# Patient Record
Sex: Female | Born: 1950 | Race: White | Hispanic: No | Smoking: Never smoker
Health system: Southern US, Community
[De-identification: ages and names within clinical notes are randomized; demographics above are authoritative.]

## PROBLEM LIST (undated history)

## (undated) DIAGNOSIS — F419 Anxiety disorder, unspecified: Secondary | ICD-10-CM

## (undated) DIAGNOSIS — F329 Major depressive disorder, single episode, unspecified: Secondary | ICD-10-CM

## (undated) DIAGNOSIS — F32A Depression, unspecified: Secondary | ICD-10-CM

## (undated) DIAGNOSIS — K219 Gastro-esophageal reflux disease without esophagitis: Secondary | ICD-10-CM

## (undated) HISTORY — DX: Gastro-esophageal reflux disease without esophagitis: K21.9

## (undated) HISTORY — PX: ADENOIDECTOMY: SUR15

## (undated) HISTORY — DX: Anxiety disorder, unspecified: F41.9

## (undated) HISTORY — DX: Depression, unspecified: F32.A

## (undated) HISTORY — PX: OOPHORECTOMY: SHX86

## (undated) HISTORY — DX: Major depressive disorder, single episode, unspecified: F32.9

## (undated) HISTORY — PX: EYE SURGERY: SHX253

## (undated) HISTORY — PX: WISDOM TOOTH EXTRACTION: SHX21

---

## 2004-01-14 ENCOUNTER — Emergency Department (HOSPITAL_COMMUNITY): Admission: EM | Admit: 2004-01-14 | Discharge: 2004-01-14 | Payer: Self-pay | Admitting: *Deleted

## 2004-03-27 ENCOUNTER — Other Ambulatory Visit: Admission: RE | Admit: 2004-03-27 | Discharge: 2004-03-27 | Payer: Self-pay | Admitting: Obstetrics and Gynecology

## 2005-05-01 ENCOUNTER — Other Ambulatory Visit: Admission: RE | Admit: 2005-05-01 | Discharge: 2005-05-01 | Payer: Self-pay | Admitting: Obstetrics and Gynecology

## 2007-05-20 HISTORY — PX: COLONOSCOPY: SHX174

## 2008-02-21 ENCOUNTER — Ambulatory Visit (HOSPITAL_COMMUNITY): Admission: RE | Admit: 2008-02-21 | Discharge: 2008-02-21 | Payer: Self-pay | Admitting: *Deleted

## 2008-02-22 ENCOUNTER — Ambulatory Visit (HOSPITAL_COMMUNITY): Admission: RE | Admit: 2008-02-22 | Discharge: 2008-02-22 | Payer: Self-pay | Admitting: *Deleted

## 2010-10-01 NOTE — Op Note (Signed)
Martha Spencer, Martha Spencer                  ACCOUNT NO.:  [id number]   MEDICAL RECORD NO.:  [id number]          PATIENT TYPE:  AMB   LOCATION:  ENDO                         FACILITY:  [facility]   PHYSICIAN:  Georgiana Spinner, M.D.    DATE OF BIRTH:  1951/02/14   DATE OF PROCEDURE:  02/21/2008  DATE OF DISCHARGE:                               OPERATIVE REPORT   PROCEDURE:  Colonoscopy.   INDICATIONS:  Colon cancer screening.   ANESTHESIA:  Fentanyl 100 mcg, Versed 8 mg.   PROCEDURE:  With the patient mildly sedated in the left lateral  decubitus position the Pentax videoscopic pediatric colonoscope was  inserted in the rectum and passed under direct vision to the right  colon.  The prep was poor in that there were multiple areas where there  was deep brownish liquid material that had solid material in it that  precluded thorough suctioning and we reached a point where I could pass  the scope no further and it was elected at this time to then terminate  the procedure and withdraw.  We withdrew the colonoscope taking  circumferential views of colonic mucosa as best we could, suctioning as  we went until we reached the rectum which appeared normal on direct and  retroflexed view.  The endoscope was straightened and withdrawn.  The  patient's vital signs, pulse oximeter remained stable.  The patient  tolerated procedure well without apparent complications.   FINDINGS:  Negative examination but prep was poor and precluded a  thorough exam, so even small lesions could have been missed.   PLAN:  Will proceed with barium enema at a later date when the patient  can be prepped again, hopefully in a better fashion to look at the  remainder of the colon.  Consider repeat examination in 5-10 years.           ______________________________  Georgiana Spinner, M.D.     GMO/MEDQ  D:  02/21/2008  T:  02/21/2008  Job:  [id number]

## 2012-11-04 ENCOUNTER — Other Ambulatory Visit: Payer: Self-pay | Admitting: Obstetrics and Gynecology

## 2012-11-04 DIAGNOSIS — R9230 Dense breasts, unspecified: Secondary | ICD-10-CM

## 2012-11-04 DIAGNOSIS — R922 Inconclusive mammogram: Secondary | ICD-10-CM

## 2012-11-15 ENCOUNTER — Other Ambulatory Visit: Payer: Self-pay

## 2012-11-16 ENCOUNTER — Ambulatory Visit
Admission: RE | Admit: 2012-11-16 | Discharge: 2012-11-16 | Disposition: A | Discharge status: Home / Self Care | Payer: 59 | Source: Ambulatory Visit | Attending: Obstetrics and Gynecology | Admitting: Obstetrics and Gynecology

## 2012-11-16 DIAGNOSIS — R922 Inconclusive mammogram: Secondary | ICD-10-CM

## 2012-11-16 MED ORDER — GADOBENATE DIMEGLUMINE 529 MG/ML IV SOLN
14.0000 mL | Freq: Once | INTRAVENOUS | Status: AC | PRN
  Administered 2012-11-16: 14 mL via INTRAVENOUS

## 2013-03-23 ENCOUNTER — Encounter (INDEPENDENT_AMBULATORY_CARE_PROVIDER_SITE_OTHER): Payer: Self-pay | Admitting: Surgery

## 2013-04-05 ENCOUNTER — Other Ambulatory Visit (INDEPENDENT_AMBULATORY_CARE_PROVIDER_SITE_OTHER): Payer: Self-pay | Admitting: Surgery

## 2013-04-05 ENCOUNTER — Ambulatory Visit (INDEPENDENT_AMBULATORY_CARE_PROVIDER_SITE_OTHER): Payer: 59 | Admitting: Surgery

## 2013-04-05 ENCOUNTER — Encounter (INDEPENDENT_AMBULATORY_CARE_PROVIDER_SITE_OTHER): Payer: Self-pay | Admitting: Surgery

## 2013-04-05 VITALS — BP 120/70 | HR 76 | Temp 97.4°F | Resp 14 | Ht 67.5 in | Wt 157.8 lb

## 2013-04-05 DIAGNOSIS — K469 Unspecified abdominal hernia without obstruction or gangrene: Secondary | ICD-10-CM

## 2013-04-05 DIAGNOSIS — R109 Unspecified abdominal pain: Secondary | ICD-10-CM

## 2013-04-05 DIAGNOSIS — R1903 Right lower quadrant abdominal swelling, mass and lump: Secondary | ICD-10-CM | POA: Insufficient documentation

## 2013-04-05 NOTE — Progress Notes (Signed)
Patient ID: Martha Spencer, female   DOB: 1951-05-13, 62 y.o.   MRN: [id number]  Chief Complaint  Patient presents with  . New Evaluation    eval abd wall hernia    HPI Martha Spencer is a 62 y.o. female.  Patient referred by Dr. Juluis Rainier for evaluation of possible right abdominal wall hernia  HPI This is a 62 year old female who presents with a three-month history of some bulging in her right abdominal wall lateral to her umbilicus. This has become slightly larger and occasionally is uncomfortable. She denies any history of trauma to this area. The bulge is not palpable when she is supine.  She has not had any imaging of this area.   Past Medical History  Diagnosis Date  . Depression   . Anxiety     Past Surgical History  Procedure Laterality Date  . Oophorectomy    . Eye surgery      History reviewed. No pertinent family history.  Social History History  Substance Use Topics  . Smoking status: Never Smoker   . Smokeless tobacco: Never Used  . Alcohol Use: No    Allergies  Allergen Reactions  . Sulfa Antibiotics     Current Outpatient Prescriptions  Medication Sig Dispense Refill  . venlafaxine XR (EFFEXOR-XR) 150 MG 24 hr capsule Take 150 mg by mouth daily with breakfast.       No current facility-administered medications for this visit.    Review of Systems Review of Systems  Constitutional: Negative for fever, chills and unexpected weight change.  HENT: Negative for congestion, hearing loss, sore throat, trouble swallowing and voice change.   Eyes: Negative for visual disturbance.  Respiratory: Negative for cough and wheezing.   Cardiovascular: Negative for chest pain, palpitations and leg swelling.  Gastrointestinal: Negative for nausea, vomiting, abdominal pain, diarrhea, constipation, blood in stool, abdominal distention and anal bleeding.  Genitourinary: Negative for hematuria, vaginal bleeding and difficulty urinating.  Musculoskeletal: Negative for  arthralgias.  Skin: Negative for rash and wound.  Neurological: Negative for seizures, syncope and headaches.  Hematological: Negative for adenopathy. Does not bruise/bleed easily.  Psychiatric/Behavioral: Negative for confusion.    Blood pressure 120/70, pulse 76, temperature 97.4 F (36.3 C), temperature source Temporal, resp. rate 14, height 5' 7.5" (1.715 m), weight 157 lb 12.8 oz (71.578 kg).  Physical Exam Physical Exam WDWN in NAD HEENT:  EOMI, sclera anicteric Neck:  No masses, no thyromegaly Lungs:  CTA bilaterally; normal respiratory effort CV:  Regular rate and rhythm; no murmurs Abd:  +bowel sounds, soft, non-tender, slight assymmetry when standing with some bulging of the right abdominal wall just below the level of the umbilicus; no obvious hernia with Valsalva.  This area is not palpable when she is supine Ext:  Well-perfused; no edema Skin:  Warm, dry; no sign of jaundice  Data Reviewed none  Assessment    Right abdominal wall asymmetry - possible Spigelian hernia vs. Intra-muscular lipoma.  Unable to determine on physical examination     Plan    CT scan of the abdomen and pelvis to rule out spigelian hernia.  Follow-up 2 weeks to review scan.        Martha Spencer K. 04/05/2013, 12:54 PM

## 2013-04-07 ENCOUNTER — Ambulatory Visit
Admission: RE | Admit: 2013-04-07 | Discharge: 2013-04-07 | Disposition: A | Discharge status: Home / Self Care | Payer: 59 | Source: Ambulatory Visit | Attending: Surgery | Admitting: Surgery

## 2013-04-07 ENCOUNTER — Telehealth (INDEPENDENT_AMBULATORY_CARE_PROVIDER_SITE_OTHER): Payer: Self-pay | Admitting: General Surgery

## 2013-04-07 DIAGNOSIS — K469 Unspecified abdominal hernia without obstruction or gangrene: Secondary | ICD-10-CM

## 2013-04-07 DIAGNOSIS — R109 Unspecified abdominal pain: Secondary | ICD-10-CM

## 2013-04-07 MED ORDER — IOHEXOL 300 MG/ML  SOLN
100.0000 mL | Freq: Once | INTRAMUSCULAR | Status: AC | PRN
  Administered 2013-04-07: 100 mL via INTRAVENOUS

## 2013-04-07 NOTE — Telephone Encounter (Signed)
Called patient to let her know that she was significant constipation and did not have a hernia, and she will need to get Miralax to make sure that she is having good daily bowel movements. And Dr Corliss Skains will go over the CT when she comes back in for a follow up visit

## 2013-04-07 NOTE — Progress Notes (Signed)
Quick Note:  Please call the patient and let them know that their CT scan showed no sign of hernia. She has significant constipation, which may be causing the bulging in her abdomen. She should try some Miralax to make sure that she is having good daily bowel movements. We will go over her CT scan when she comes in for her visit.  ______

## 2013-04-11 ENCOUNTER — Encounter (INDEPENDENT_AMBULATORY_CARE_PROVIDER_SITE_OTHER): Payer: Self-pay | Admitting: Surgery

## 2013-04-11 ENCOUNTER — Ambulatory Visit (INDEPENDENT_AMBULATORY_CARE_PROVIDER_SITE_OTHER): Payer: 59 | Admitting: Surgery

## 2013-04-11 VITALS — BP 100/68 | HR 68 | Resp 16 | Ht 67.75 in | Wt 155.4 lb

## 2013-04-11 DIAGNOSIS — K5900 Constipation, unspecified: Secondary | ICD-10-CM

## 2013-04-11 DIAGNOSIS — K5909 Other constipation: Secondary | ICD-10-CM | POA: Insufficient documentation

## 2013-04-11 NOTE — Patient Instructions (Signed)
Clear liquids, magnesium citrate (1-2 bottles) until bowel movements clear Fiber supplement

## 2013-04-11 NOTE — Progress Notes (Signed)
Followup after her CT scan. The scan showed no sign of abdominal wall hernia. She does have significant constipation throughout her colon. We reviewed the images. We discussed a regimen for and occasional bowel prep with clear liquids and magnesium citrate. If these problems persist then she may need a referral to GI for further management. There are no surgical indications at this time. We will be glad to see her back as needed.   Ct Abdomen Pelvis W Contrast  04/07/2013   CLINICAL DATA:  Right lower quadrant discomfort, evaluate for possible hernia  EXAM: CT ABDOMEN AND PELVIS WITH CONTRAST  TECHNIQUE: Multidetector CT imaging of the abdomen and pelvis was performed using the standard protocol following bolus administration of intravenous contrast.  CONTRAST:  OMNIPAQUE IOHEXOL 300 MG/ML  SOLN  COMPARISON:  Nine  BUN and creatinine were obtained on site at [facility] at [address]Results: BUN 14 mg/dL, Creatinine 0.9 mg/dL.  FINDINGS: The lung bases are clear. The liver enhances, it is a probable cyst anteriorly in the right lobe of approximately 1.4 cm. In addition, posteriorly in the right lobe peripherally and inferiorly in the right lobe, there are low-attenuation structures which appear to enhance peripherally, and become almost isodense on delayed images, consistent with incidental hepatic hemangiomas. No calcified gallstones are noted. The pancreas is normal in size in the pancreatic duct is not dilated. The adrenal glands and spleen are unremarkable. The stomach is moderately fluid distended with no abnormality evident. The kidneys enhance with no calculus or mass and no hydronephrosis is seen. On delayed images, the pelvocaliceal systems are unremarkable. There is a tiny cyst in the upper pole of the right kidney posterior medially. The abdominal aorta is normal in caliber. No adenopathy is seen.  No abdominal wall hernia is noted. The urinary bladder is not well distended but  no abnormality is noted. The uterus is normal in size and a small left adnexal cyst is noted of 1.9 cm. No free fluid is seen within the pelvis. There is a moderate amount of feces throughout the colon. The terminal ileum and the appendix are unremarkable. There is degenerative disc disease primarily at L4-5 and L5-S1 levels.  IMPRESSION: 1. No explanation for the right lower quadrant discomfort is seen. No abdominal wall hernia is noted. 2. 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 3. The appendix and terminal ileum are unremarkable. 4. Degenerative disc disease primarily at L4-5 and L5-S1.   Electronically Signed   By: Dwyane Dee M.D.   On: 04/07/2013 10:33     Wilmon Arms. Corliss Skains, MD, [facility]  General/ Trauma Surgery  04/11/2013 2:48 PM

## 2015-03-19 ENCOUNTER — Other Ambulatory Visit: Payer: Self-pay | Admitting: Obstetrics and Gynecology

## 2015-03-19 DIAGNOSIS — M2611 Maxillary asymmetry: Secondary | ICD-10-CM

## 2015-03-26 ENCOUNTER — Other Ambulatory Visit: Payer: Self-pay | Admitting: Obstetrics and Gynecology

## 2015-03-26 ENCOUNTER — Ambulatory Visit
Admission: RE | Admit: 2015-03-26 | Discharge: 2015-03-26 | Disposition: A | Discharge status: Home / Self Care | Payer: 59 | Source: Ambulatory Visit | Attending: Obstetrics and Gynecology | Admitting: Obstetrics and Gynecology

## 2015-03-26 DIAGNOSIS — M2611 Maxillary asymmetry: Secondary | ICD-10-CM

## 2015-03-26 DIAGNOSIS — R2230 Localized swelling, mass and lump, unspecified upper limb: Secondary | ICD-10-CM

## 2015-03-26 DIAGNOSIS — R222 Localized swelling, mass and lump, trunk: Secondary | ICD-10-CM

## 2016-06-11 IMAGING — MG MM DIAG BREAST TOMO UNI RIGHT
6 series · 6 of 18 positions shown · non-contrast
Comparison: 12/06/2014

CLINICAL DATA: 64-year-old female with focal pain and thickening in
the right axilla discovered on self-examination.

EXAM:
DIGITAL DIAGNOSTIC RIGHT MAMMOGRAM WITH 3D TOMOSYNTHESIS WITH CAD
ULTRASOUND RIGHT AXILLA

[R TAN]
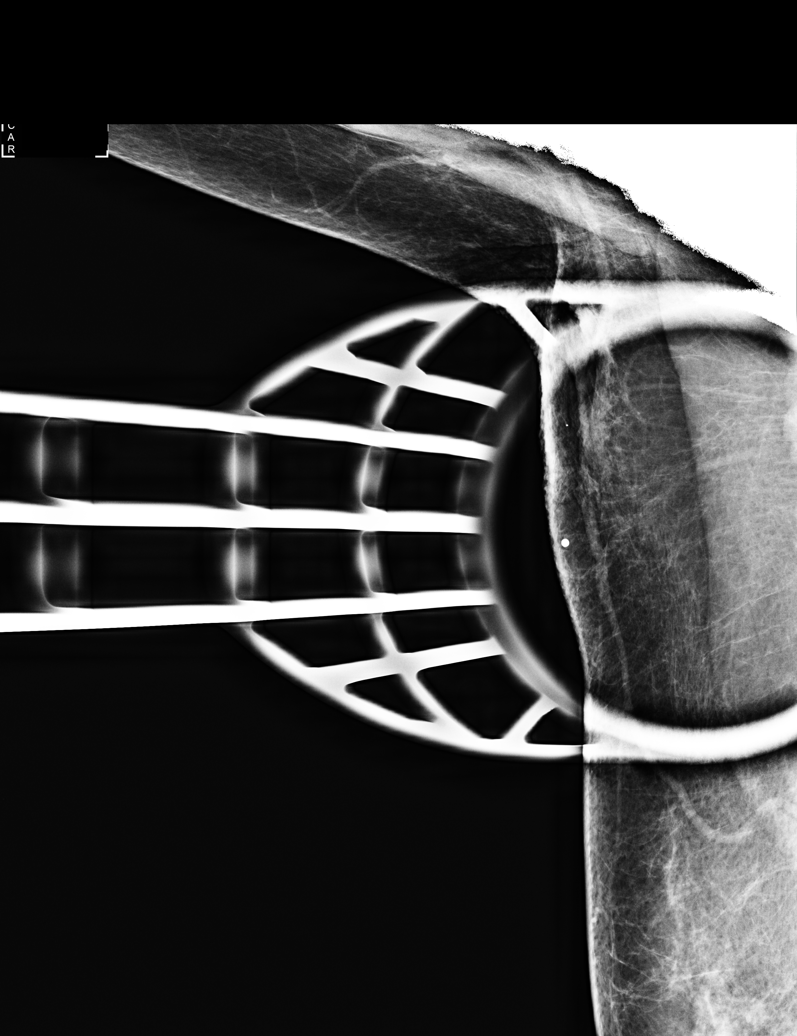

[R MLO]
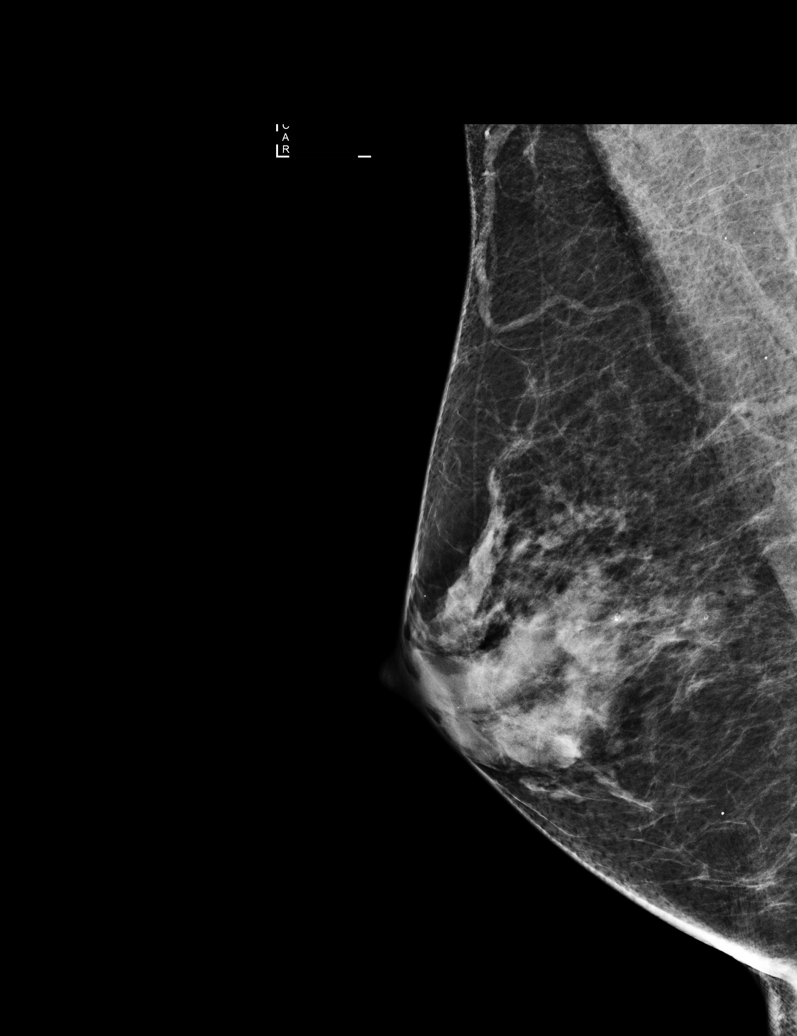

[R CC]
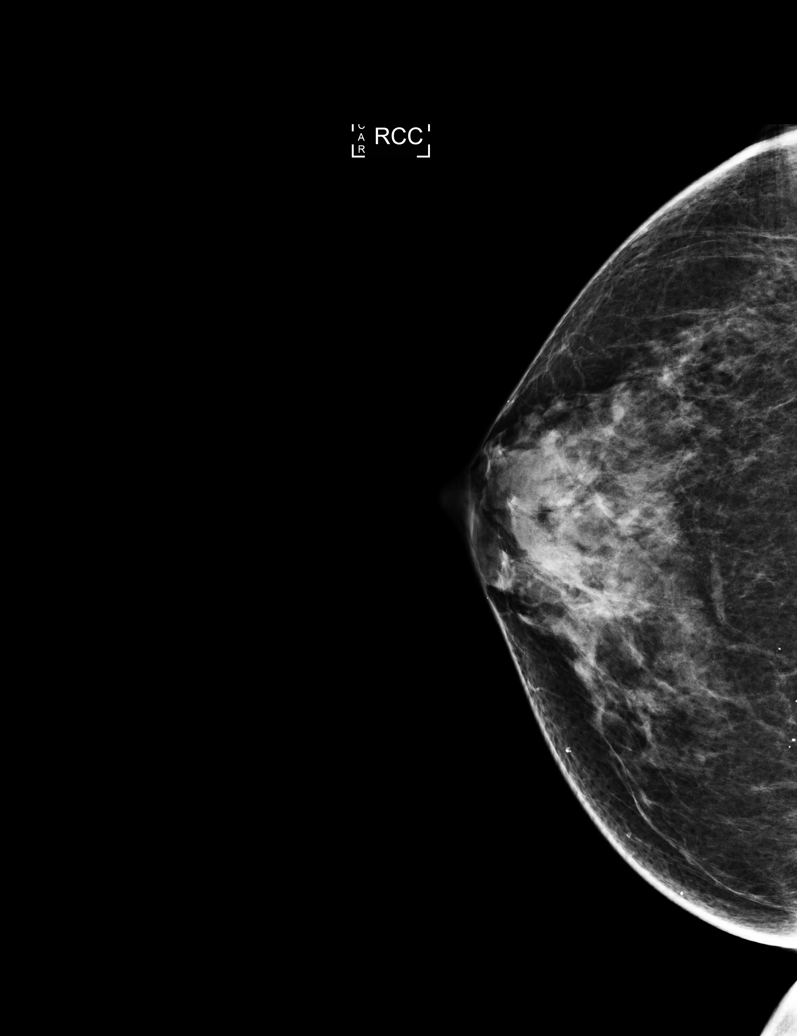

[R CC tomo · tomo slice 29/58.0]
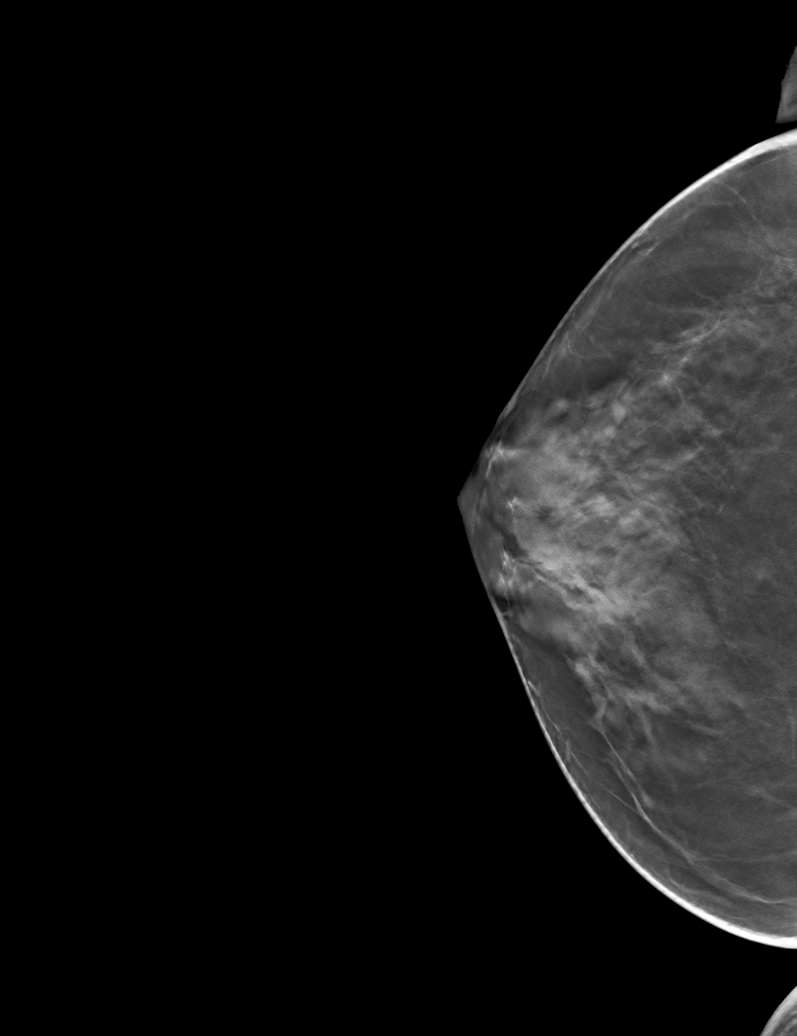

[R MLO tomo · tomo slice 31/61.0]
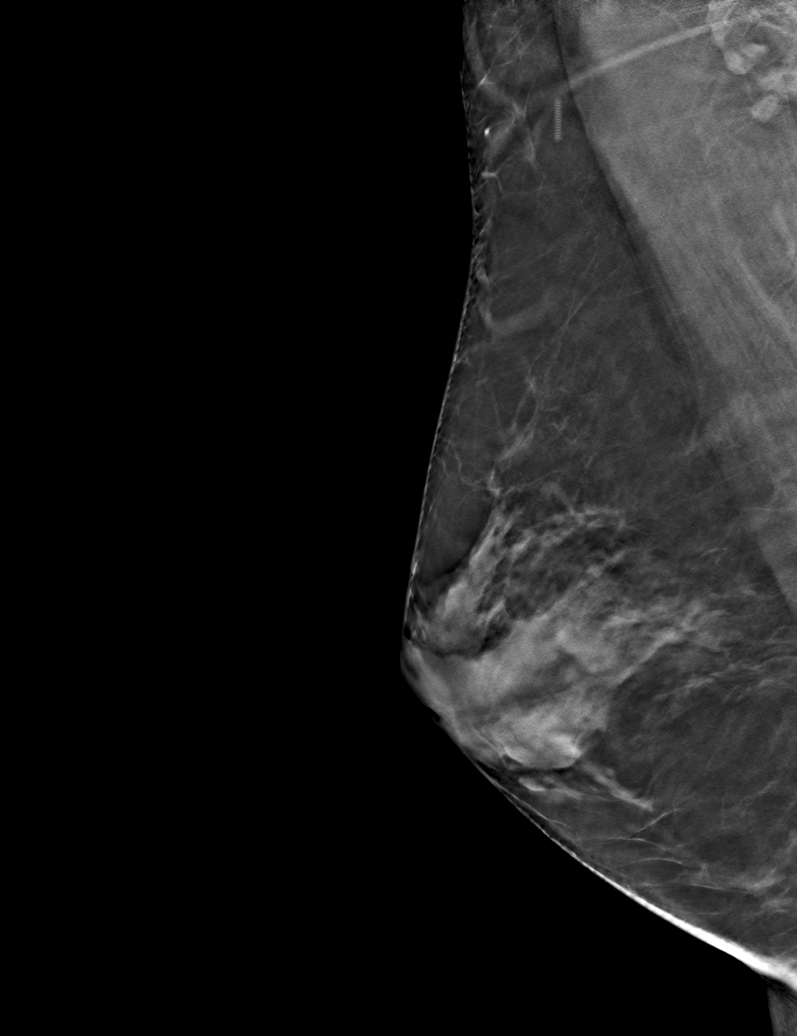

[R TAN tomo · tomo slice 24/47.0]
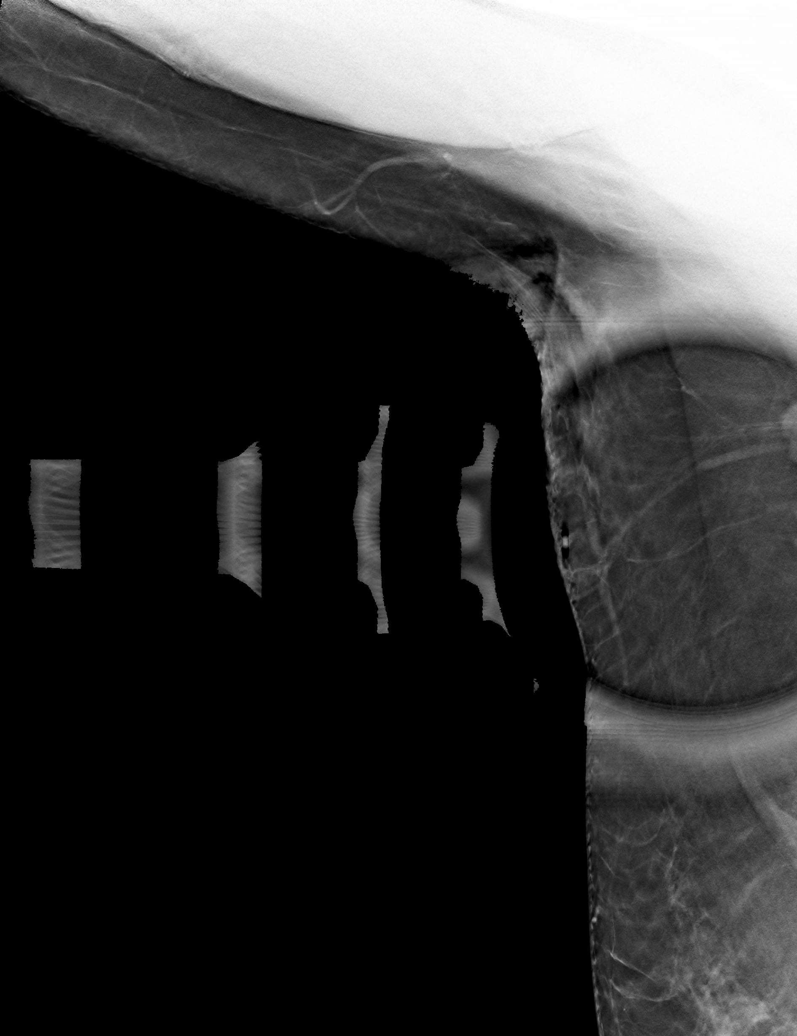

[6 of 18 positions shown; findings below may reference images not displayed]

ACR Breast Density Category c: The breast tissue is heterogeneously
dense, which may obscure small masses.
FINDINGS: 2D and 3D full field views of the right breast and spot compression
view of the right axilla demonstrates no suspicious mass, distortion
or worrisome calcifications.

Mammographic images were processed with CAD.

On physical exam, no palpable abnormalities identified in the right
axilla.

Targeted ultrasound is performed, showing no evidence of solid or
cystic mass, distortion or abnormal areas of shadowing within the
right axilla.
IMPRESSION: No mammographic, suspicious palpable or sonographic abnormality
within the right axilla.

No mammographic evidence of right breast malignancy.

RECOMMENDATION:
Bilateral screening mammograms in 8 months to resume annual
mammogram schedule.

I have discussed the findings and recommendations with the patient.
Results were also provided in writing at the conclusion of the
visit. If applicable, a reminder letter will be sent to the patient
regarding the next appointment.

BI-RADS CATEGORY  1: Negative.

## 2017-12-11 ENCOUNTER — Encounter: Payer: Self-pay | Admitting: Gastroenterology

## 2018-01-26 ENCOUNTER — Telehealth: Payer: Self-pay | Admitting: Gastroenterology

## 2018-01-26 ENCOUNTER — Ambulatory Visit: Payer: Medicare PPO | Admitting: *Deleted

## 2018-01-26 VITALS — Ht 67.0 in | Wt 148.0 lb

## 2018-01-26 DIAGNOSIS — Z1211 Encounter for screening for malignant neoplasm of colon: Secondary | ICD-10-CM

## 2018-01-26 MED ORDER — NA SULFATE-K SULFATE-MG SULF 17.5-3.13-1.6 GM/177ML PO SOLN: 1.0000 | Freq: Once | ORAL | 0 refills | Status: AC

## 2018-01-26 NOTE — Telephone Encounter (Signed)
Spoke with pt. Will send new instructions for her colon on 03/15/18 with Dr Tarri Glenn. Pt will call back if she has further questions. Gwyndolyn Saxon

## 2018-01-26 NOTE — Progress Notes (Signed)
No egg or soy allergy known to patient  No issues with past sedation with any surgeries  or procedures, no intubation problems  No diet pills per patient No home 02 use per patient  No blood thinners per patient  Pt denies issues with constipation  No A fib or A flutter  EMMI video sent to pt's e mail  

## 2018-02-09 ENCOUNTER — Encounter: Payer: 59 | Admitting: Gastroenterology

## 2018-03-02 ENCOUNTER — Encounter: Payer: Self-pay | Admitting: Gastroenterology

## 2018-03-15 ENCOUNTER — Ambulatory Visit (AMBULATORY_SURGERY_CENTER): Payer: 59 | Admitting: Gastroenterology

## 2018-03-15 ENCOUNTER — Encounter: Payer: Self-pay | Admitting: Gastroenterology

## 2018-03-15 ENCOUNTER — Other Ambulatory Visit: Payer: Self-pay

## 2018-03-15 VITALS — BP 126/72 | HR 70 | Temp 98.0°F | Resp 11 | Ht 67.0 in | Wt 148.0 lb

## 2018-03-15 DIAGNOSIS — Z1211 Encounter for screening for malignant neoplasm of colon: Secondary | ICD-10-CM | POA: Diagnosis not present

## 2018-03-15 DIAGNOSIS — K635 Polyp of colon: Secondary | ICD-10-CM | POA: Diagnosis not present

## 2018-03-15 DIAGNOSIS — D122 Benign neoplasm of ascending colon: Secondary | ICD-10-CM

## 2018-03-15 DIAGNOSIS — D125 Benign neoplasm of sigmoid colon: Secondary | ICD-10-CM

## 2018-03-15 NOTE — Progress Notes (Signed)
To PACU, VSS. Report to [email or web address].tb 

## 2018-03-15 NOTE — Patient Instructions (Signed)
YOU HAD AN ENDOSCOPIC PROCEDURE TODAY AT [facility]:   Refer to the procedure report that was given to you for any specific questions about what was found during the examination.  If the procedure report does not answer your questions, please call your gastroenterologist to clarify.  If you requested that your care partner not be given the details of your procedure findings, then the procedure report has been included in a sealed envelope for you to review at your convenience later.  YOU SHOULD EXPECT: Some feelings of bloating in the abdomen. Passage of more gas than usual.  Walking can help get rid of the air that was put into your GI tract during the procedure and reduce the bloating. If you had a lower endoscopy (such as a colonoscopy or flexible sigmoidoscopy) you may notice spotting of blood in your stool or on the toilet paper. If you underwent a bowel prep for your procedure, you may not have a normal bowel movement for a few days.  Please Note:  You might notice some irritation and congestion in your nose or some drainage.  This is from the oxygen used during your procedure.  There is no need for concern and it should clear up in a day or so.  SYMPTOMS TO REPORT IMMEDIATELY:   Following lower endoscopy (colonoscopy or flexible sigmoidoscopy):  Excessive amounts of blood in the stool  Significant tenderness or worsening of abdominal pains  Swelling of the abdomen that is new, acute  Fever of 100F or higher   For urgent or emergent issues, a gastroenterologist can be reached at any hour by calling [phone].   DIET:  We do recommend a small meal at first, but then you may proceed to your regular diet.  Drink plenty of fluids but you should avoid alcoholic beverages for 24 hours. Try to eat plenty of fiber, and drink a lot of water.  ACTIVITY:  You should plan to take it easy for the rest of today and you should NOT DRIVE or use heavy machinery until tomorrow  (because of the sedation medicines used during the test).    FOLLOW UP: Our staff will call the number listed on your records the next business day following your procedure to check on you and address any questions or concerns that you may have regarding the information given to you following your procedure. If we do not reach you, we will leave a message.  However, if you are feeling well and you are not experiencing any problems, there is no need to return our call.  We will assume that you have returned to your regular daily activities without incident.  If any biopsies were taken you will be contacted by phone or by letter within the next 1-3 weeks.  Please call us at [phone] if you have not heard about the biopsies in 3 weeks.    SIGNATURES/CONFIDENTIALITY: You and/or your care partner have signed paperwork which will be entered into your electronic medical record.  These signatures attest to the fact that that the information above on your After Visit Summary has been reviewed and is understood.  Full responsibility of the confidentiality of this discharge information lies with you and/or your care-partner.  Read all handouts given to you by your recovery room nurse.

## 2018-03-15 NOTE — Progress Notes (Signed)
History reviewed today 

## 2018-03-15 NOTE — Op Note (Signed)
[facility] Patient Name: Mercedez Boule Procedure Date: 03/15/2018 1:33 PM MRN: [id number] Endoscopist: Thornton Park MD, MD Age: 67 Referring MD:  Date of Birth: 06-21-1950 Gender: Female Account #: [id number] Procedure:                Colonoscopy Indications:              Screening for colorectal malignant neoplasm. No                            known family history of colon cancer or polyps. No                            baseline GI symptoms except for chronic                            constipation. Medicines:                See the Anesthesia note for documentation of the                            administered medications Procedure:                Pre-Anesthesia Assessment:                           - Prior to the procedure, a History and Physical                            was performed, and patient medications and                            allergies were reviewed. The patient's tolerance of                            previous anesthesia was also reviewed. The risks                            and benefits of the procedure and the sedation                            options and risks were discussed with the patient.                            All questions were answered, and informed consent                            was obtained. Prior Anticoagulants: The patient has                            taken no previous anticoagulant or antiplatelet                            agents. ASA Grade Assessment: II - A patient with  mild systemic disease. After reviewing the risks                            and benefits, the patient was deemed in                            satisfactory condition to undergo the procedure.                           After obtaining informed consent, the colonoscope                            was passed under direct vision. Throughout the                            procedure, the patient's blood pressure, pulse, and                             oxygen saturations were monitored continuously. The                            Colonoscope was introduced through the anus and                            advanced to the the cecum, identified by                            appendiceal orifice and ileocecal valve. The                            colonoscopy was technically difficult and complex                            due to a redundant colon, significant looping and a                            tortuous colon. The patient tolerated the procedure                            well. The quality of the bowel preparation was good. Scope In: 1:39:32 PM Scope Out: 2:08:46 PM Scope Withdrawal Time: 0 hours 15 minutes 31 seconds  Total Procedure Duration: 0 hours 29 minutes 14 seconds  Findings:                 The perianal and digital rectal examinations were                            normal.                           A diffuse area of mild melanosis was found in the                            entire colon.  Four sessile polyps were found in the sigmoid colon                            and ascending colon. The polyps were 2 to 3 mm in                            size. These polyps were removed with a cold biopsy                            forceps. Resection and retrieval were complete.                           A 7 mm polyp was found in the distal ascending                            colon. The polyp was flat. The polyp was removed                            with a cold snare. Resection and retrieval were                            complete.                           The exam was otherwise without abnormality on                            direct and retroflexion views. Complications:            No immediate complications. Estimated Blood Loss:     Estimated blood loss: none. Impression:               - Melanosis in the colon.                           - Four 2 to 3 mm polyps in the sigmoid colon and in                             the ascending colon, removed with a cold biopsy                            forceps. Resected and retrieved.                           - One 7 mm polyp in the distal ascending colon,                            removed with a cold snare. Resected and retrieved.                           - The examination was otherwise normal on direct                            and  retroflexion views. Recommendation:           - Discharge patient to home.                           - Resume previous diet.                           - Continue present medications.                           - Await pathology results.                           - Repeat colonoscopy date to be determined after                            pending pathology results are reviewed for                            surveillance based on pathology results. Repeat                            colonoscopy in 3 years if at least 3 polyps are                            adenomatous, otherwise repeat colonoscopy in 5                            years. Thornton Park MD, MD 03/15/2018 2:14:08 PM This report has been signed electronically.

## 2018-03-15 NOTE — Progress Notes (Signed)
Called to room to assist during endoscopic procedure.  Patient ID and intended procedure confirmed with present staff. Received instructions for my participation in the procedure from the performing physician.  

## 2018-03-16 ENCOUNTER — Telehealth: Payer: Self-pay

## 2018-03-16 ENCOUNTER — Telehealth: Payer: Self-pay | Admitting: *Deleted

## 2018-03-16 NOTE — Telephone Encounter (Signed)
  Follow up Call-  Call back number 03/15/2018  Post procedure Call Back phone  # [phone]  Permission to leave phone message Yes  Some recent data might be hidden     Patient questions:  Do you have a fever, pain , or abdominal swelling? No. Pain Score  0 *  Have you tolerated food without any problems? Yes.    Have you been able to return to your normal activities?yes  Do you have any questions about your discharge instructions: Diet   No. Medications  No. Follow up visit  No.  Do you have questions or concerns about your Care? No.  Actions: * If pain score is 4 or above: No action needed, pain <4.

## 2018-03-16 NOTE — Telephone Encounter (Signed)
  Follow up Call-  Call back number 03/15/2018  Post procedure Call Back phone  # [phone]  Permission to leave phone message Yes  Some recent data might be hidden     No answer

## 2018-03-22 ENCOUNTER — Encounter: Payer: Self-pay | Admitting: Gastroenterology

## 2018-12-30 DIAGNOSIS — L900 Lichen sclerosus et atrophicus: Secondary | ICD-10-CM | POA: Diagnosis not present

## 2018-12-30 DIAGNOSIS — Z01419 Encounter for gynecological examination (general) (routine) without abnormal findings: Secondary | ICD-10-CM | POA: Diagnosis not present

## 2018-12-30 DIAGNOSIS — Z6822 Body mass index (BMI) 22.0-22.9, adult: Secondary | ICD-10-CM | POA: Diagnosis not present

## 2018-12-30 DIAGNOSIS — F419 Anxiety disorder, unspecified: Secondary | ICD-10-CM | POA: Diagnosis not present

## 2019-01-05 DIAGNOSIS — Z1231 Encounter for screening mammogram for malignant neoplasm of breast: Secondary | ICD-10-CM | POA: Diagnosis not present

## 2019-06-19 ENCOUNTER — Ambulatory Visit: Payer: Medicare PPO

## 2019-06-25 ENCOUNTER — Ambulatory Visit: Payer: Medicare Other | Attending: Internal Medicine

## 2019-06-25 DIAGNOSIS — Z23 Encounter for immunization: Secondary | ICD-10-CM | POA: Insufficient documentation

## 2019-06-25 NOTE — Progress Notes (Signed)
   Covid-19 Vaccination Clinic  Name:  Achaia Bornt    MRN: [id number] DOB: Dec 14, 1950  06/25/2019  Ms. Hearst was observed post Covid-19 immunization for 15 minutes without incidence. She was provided with Vaccine Information Sheet and instruction to access the V-Safe system.   Ms. Crisman was instructed to call 911 with any severe reactions post vaccine: Marland Kitchen Difficulty breathing  . Swelling of your face and throat  . A fast heartbeat  . A bad rash all over your body  . Dizziness and weakness    Immunizations Administered    Name Date Dose VIS Date Route   Pfizer COVID-19 Vaccine 06/25/2019  8:27 AM 0.3 mL 04/29/2019 Intramuscular   Manufacturer: [place]   Lot: [id number]   [place]: [id number]

## 2019-06-30 ENCOUNTER — Ambulatory Visit: Payer: Medicare PPO

## 2019-07-20 ENCOUNTER — Ambulatory Visit: Payer: Medicare Other | Attending: Internal Medicine

## 2019-07-20 DIAGNOSIS — Z23 Encounter for immunization: Secondary | ICD-10-CM | POA: Insufficient documentation

## 2019-07-20 NOTE — Progress Notes (Signed)
   Covid-19 Vaccination Clinic  Name:  Martha Spencer    MRN: [id number] DOB: Jan 29, 1951  07/20/2019  [name] was observed post Covid-19 immunization for 15 minutes without incident. She was provided with Vaccine Information Sheet and instruction to access the V-Safe system.   [name] was instructed to call 911 with any severe reactions post vaccine: Marland Kitchen Difficulty breathing  . Swelling of face and throat  . A fast heartbeat  . A bad rash all over body  . Dizziness and weakness   Immunizations Administered    Name Date Dose VIS Date Route   Pfizer COVID-19 Vaccine 07/20/2019  8:28 AM 0.3 mL 04/29/2019 Intramuscular   Manufacturer: [place]   Lot: [id number]   [place]: [id number]

## 2022-09-09 LAB — HM MAMMOGRAPHY

## 2022-12-06 ENCOUNTER — Other Ambulatory Visit: Payer: Self-pay

## 2022-12-06 ENCOUNTER — Inpatient Hospital Stay (HOSPITAL_COMMUNITY)
Admission: EM | Admit: 2022-12-06 | Discharge: 2022-12-08 | DRG: 065 | Disposition: A | Discharge status: Home / Self Care | Payer: Medicare Other | Attending: Internal Medicine | Admitting: Internal Medicine

## 2022-12-06 ENCOUNTER — Observation Stay (HOSPITAL_COMMUNITY): Payer: Medicare Other

## 2022-12-06 ENCOUNTER — Encounter (HOSPITAL_COMMUNITY): Payer: Self-pay

## 2022-12-06 ENCOUNTER — Emergency Department (HOSPITAL_COMMUNITY): Payer: Medicare Other

## 2022-12-06 DIAGNOSIS — I639 Cerebral infarction, unspecified: Secondary | ICD-10-CM | POA: Diagnosis present

## 2022-12-06 DIAGNOSIS — I6349 Cerebral infarction due to embolism of other cerebral artery: Principal | ICD-10-CM | POA: Diagnosis present

## 2022-12-06 DIAGNOSIS — E785 Hyperlipidemia, unspecified: Secondary | ICD-10-CM | POA: Diagnosis present

## 2022-12-06 DIAGNOSIS — Z8249 Family history of ischemic heart disease and other diseases of the circulatory system: Secondary | ICD-10-CM

## 2022-12-06 DIAGNOSIS — F419 Anxiety disorder, unspecified: Secondary | ICD-10-CM | POA: Diagnosis present

## 2022-12-06 DIAGNOSIS — I10 Essential (primary) hypertension: Secondary | ICD-10-CM | POA: Diagnosis present

## 2022-12-06 DIAGNOSIS — Z79899 Other long term (current) drug therapy: Secondary | ICD-10-CM

## 2022-12-06 DIAGNOSIS — R1310 Dysphagia, unspecified: Secondary | ICD-10-CM | POA: Diagnosis present

## 2022-12-06 DIAGNOSIS — Z91018 Allergy to other foods: Secondary | ICD-10-CM

## 2022-12-06 DIAGNOSIS — R471 Dysarthria and anarthria: Secondary | ICD-10-CM | POA: Diagnosis present

## 2022-12-06 DIAGNOSIS — R4701 Aphasia: Secondary | ICD-10-CM | POA: Diagnosis present

## 2022-12-06 DIAGNOSIS — Z7982 Long term (current) use of aspirin: Secondary | ICD-10-CM

## 2022-12-06 DIAGNOSIS — Z882 Allergy status to sulfonamides status: Secondary | ICD-10-CM

## 2022-12-06 DIAGNOSIS — R29701 NIHSS score 1: Secondary | ICD-10-CM | POA: Diagnosis present

## 2022-12-06 DIAGNOSIS — F32A Depression, unspecified: Secondary | ICD-10-CM | POA: Diagnosis present

## 2022-12-06 DIAGNOSIS — E119 Type 2 diabetes mellitus without complications: Secondary | ICD-10-CM | POA: Diagnosis present

## 2022-12-06 DIAGNOSIS — Z602 Problems related to living alone: Secondary | ICD-10-CM | POA: Diagnosis present

## 2022-12-06 DIAGNOSIS — I63412 Cerebral infarction due to embolism of left middle cerebral artery: Secondary | ICD-10-CM

## 2022-12-06 DIAGNOSIS — G8191 Hemiplegia, unspecified affecting right dominant side: Secondary | ICD-10-CM | POA: Diagnosis present

## 2022-12-06 DIAGNOSIS — K219 Gastro-esophageal reflux disease without esophagitis: Secondary | ICD-10-CM | POA: Diagnosis present

## 2022-12-06 DIAGNOSIS — Z8673 Personal history of transient ischemic attack (TIA), and cerebral infarction without residual deficits: Secondary | ICD-10-CM

## 2022-12-06 DIAGNOSIS — Z7902 Long term (current) use of antithrombotics/antiplatelets: Secondary | ICD-10-CM

## 2022-12-06 LAB — URINALYSIS, COMPLETE (UACMP) WITH MICROSCOPIC
Bacteria, UA: NONE SEEN
Bilirubin Urine: NEGATIVE
Glucose, UA: NEGATIVE mg/dL
Hgb urine dipstick: NEGATIVE
Ketones, ur: NEGATIVE mg/dL
Leukocytes,Ua: NEGATIVE
Nitrite: NEGATIVE
Protein, ur: 30 mg/dL — AB
Specific Gravity, Urine: 1.01 (ref 1.005–1.030)
pH: 8 (ref 5.0–8.0)

## 2022-12-06 LAB — DIFFERENTIAL
Abs Immature Granulocytes: 0.01 10*3/uL (ref 0.00–0.07)
Basophils Absolute: 0.1 10*3/uL (ref 0.0–0.1)
Basophils Relative: 1 %
Eosinophils Absolute: 0.2 10*3/uL (ref 0.0–0.5)
Eosinophils Relative: 4 %
Immature Granulocytes: 0 %
Lymphocytes Relative: 27 %
Lymphs Abs: 1.5 10*3/uL (ref 0.7–4.0)
Monocytes Absolute: 0.6 10*3/uL (ref 0.1–1.0)
Monocytes Relative: 10 %
Neutro Abs: 3.2 10*3/uL (ref 1.7–7.7)
Neutrophils Relative %: 58 %

## 2022-12-06 LAB — TSH: TSH: 4.53 u[IU]/mL — ABNORMAL HIGH (ref 0.350–4.500)

## 2022-12-06 LAB — RAPID URINE DRUG SCREEN, HOSP PERFORMED
Amphetamines: NOT DETECTED
Barbiturates: NOT DETECTED
Benzodiazepines: NOT DETECTED
Cocaine: NOT DETECTED
Opiates: NOT DETECTED
Tetrahydrocannabinol: NOT DETECTED

## 2022-12-06 LAB — COMPREHENSIVE METABOLIC PANEL
ALT: 24 U/L (ref 0–44)
AST: 27 U/L (ref 15–41)
Albumin: 4.1 g/dL (ref 3.5–5.0)
Alkaline Phosphatase: 74 U/L (ref 38–126)
Anion gap: 11 (ref 5–15)
BUN: 14 mg/dL (ref 8–23)
CO2: 25 mmol/L (ref 22–32)
Calcium: 9.2 mg/dL (ref 8.9–10.3)
Chloride: 102 mmol/L (ref 98–111)
Creatinine, Ser: 0.86 mg/dL (ref 0.44–1.00)
GFR, Estimated: >60 mL/min (ref >60)
Glucose, Bld: 96 mg/dL (ref 70–99)
Potassium: 3.8 mmol/L (ref 3.5–5.1)
Sodium: 138 mmol/L (ref 135–145)
Total Bilirubin: 0.7 mg/dL (ref 0.3–1.2)
Total Protein: 7.1 g/dL (ref 6.5–8.1)

## 2022-12-06 LAB — PROTIME-INR
INR: 1.1 (ref 0.8–1.2)
Prothrombin Time: 14.1 seconds (ref 11.4–15.2)

## 2022-12-06 LAB — ETHANOL: Alcohol, Ethyl (B): <10 mg/dL (ref <10)

## 2022-12-06 LAB — HEMOGLOBIN A1C
Hgb A1c MFr Bld: 5.6 % (ref 4.8–5.6)
Mean Plasma Glucose: 114.02 mg/dL

## 2022-12-06 LAB — CBC
HCT: 41.2 % (ref 36.0–46.0)
Hemoglobin: 13.8 g/dL (ref 12.0–15.0)
MCH: 31.3 pg (ref 26.0–34.0)
MCHC: 33.5 g/dL (ref 30.0–36.0)
MCV: 93.4 fL (ref 80.0–100.0)
Platelets: 217 10*3/uL (ref 150–400)
RBC: 4.41 MIL/uL (ref 3.87–5.11)
RDW: 12.4 % (ref 11.5–15.5)
WBC: 5.6 10*3/uL (ref 4.0–10.5)
nRBC: 0 % (ref 0.0–0.2)

## 2022-12-06 LAB — CBG MONITORING, ED: Glucose-Capillary: 85 mg/dL (ref 70–99)

## 2022-12-06 LAB — APTT: aPTT: 30 seconds (ref 24–36)

## 2022-12-06 LAB — HIV ANTIBODY (ROUTINE TESTING W REFLEX): HIV Screen 4th Generation wRfx: NONREACTIVE

## 2022-12-06 MED ORDER — ENOXAPARIN SODIUM 40 MG/0.4ML IJ SOSY
40.0000 mg | PREFILLED_SYRINGE | INTRAMUSCULAR | Status: DC
  Administered 2022-12-06 – 2022-12-07 (×2): 40 mg via SUBCUTANEOUS
  Filled 2022-12-06 (×2): qty 0.4

## 2022-12-06 MED ORDER — STROKE: EARLY STAGES OF RECOVERY BOOK
Freq: Once | Status: AC
  Filled 2022-12-06: qty 1

## 2022-12-06 MED ORDER — VENLAFAXINE HCL ER 75 MG PO CP24
150.0000 mg | ORAL_CAPSULE | Freq: Every day | ORAL | Status: DC
  Administered 2022-12-06: 150 mg via ORAL
  Filled 2022-12-06 (×2): qty 2

## 2022-12-06 MED ORDER — IOHEXOL 350 MG/ML SOLN
75.0000 mL | Freq: Once | INTRAVENOUS | Status: AC | PRN
  Administered 2022-12-06: 75 mL via INTRAVENOUS

## 2022-12-06 MED ORDER — ACETAMINOPHEN 650 MG RE SUPP: 650.0000 mg | Freq: Four times a day (QID) | RECTAL | Status: DC | PRN

## 2022-12-06 MED ORDER — LORAZEPAM 2 MG/ML IJ SOLN
0.5000 mg | Freq: Once | INTRAMUSCULAR | Status: AC
  Administered 2022-12-06: 0.5 mg via INTRAVENOUS
  Filled 2022-12-06: qty 1

## 2022-12-06 MED ORDER — SODIUM CHLORIDE 0.9% FLUSH
3.0000 mL | Freq: Two times a day (BID) | INTRAVENOUS | Status: DC
  Administered 2022-12-06 – 2022-12-08 (×5): 3 mL via INTRAVENOUS

## 2022-12-06 MED ORDER — ASPIRIN 81 MG PO CHEW
81.0000 mg | CHEWABLE_TABLET | Freq: Every day | ORAL | Status: DC
  Administered 2022-12-06 – 2022-12-08 (×3): 81 mg via ORAL
  Filled 2022-12-06 (×3): qty 1

## 2022-12-06 MED ORDER — ACETAMINOPHEN 325 MG PO TABS: 650.0000 mg | ORAL_TABLET | Freq: Four times a day (QID) | ORAL | Status: DC | PRN

## 2022-12-06 MED ORDER — CLOPIDOGREL BISULFATE 75 MG PO TABS
75.0000 mg | ORAL_TABLET | Freq: Every day | ORAL | Status: DC
  Administered 2022-12-06 – 2022-12-08 (×3): 75 mg via ORAL
  Filled 2022-12-06 (×3): qty 1

## 2022-12-06 NOTE — ED Notes (Signed)
Patient able to tolerate soft foods without any issue. 1 small cup of apple sauce, 1 small cup of ice cream.

## 2022-12-06 NOTE — H&P (Signed)
Date: 12/06/2022               Patient Name:  Martha Spencer MRN: [id number]  DOB: June 01, 1950 Age / Sex: 72 y.o., female   PCP: Patient, No Pcp Per         Medical Service: Internal Medicine Teaching Service         Attending Physician: Dr. Inez Catalina, MD    First Contact: Dr. Denton Brick  Pager: [phone]  Second Contact: Dr. Morene Crocker  Pager: [phone]       After Hours (After 5p/  First Contact Pager: [phone]  weekends / holidays): Second Contact Pager: [phone]   Chief Complaint: Speech changes  History of Present Illness:  Patient is a 72 y.o. with a history of depression who was admitted to the ED for speech changes. Her friend Idamae Lusher is in the room.   [facility] Thursday 7/18. Patient lives alone and reports that she noticed speech/swallowing changes yesterday. Per patient, had one episode of choking on coffee in the morning. Didn't think much of it. While watching TV, noticed she had difficulty repeating things accurately, voice sounded different. Woke up this morning, drank a few sips of coffee with no further choking, but became concerned when voice seemed slower and different than usual and called EMS. Endorses slight headache now but thinks it may be due to caffeine withdrawal since she limited her usual coffee intake to a few sips today. Radene Journey stated patient's voice is slower and different from baseline. Patient denies falls, numbness, weakness, visual changes, or difficulties ambulating. Denies urinary incontinence or constipation.   Reports history of depression, on antidepressant. Does not have a PCP, sees medical provider at [facility] from time to time. Is interested in establishing with [facility]. Denies any drug or alcohol use. Wears glasses at baseline. No hearing aid or dentures.   Reviewed status with patient, confirmed FULL CODE.   Meds:  No outpatient medications have been marked as taking for the 12/06/22 encounter [facility]  Encounter).    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  Drives, able to perform ADLs and IADLs independently.  Friends are support system. Denies use of alcohol, substance use including illicit drugs, does not smoke or use tobacco   Review of Systems: A complete ROS was negative except as per HPI.   Physical Exam: Blood pressure (!) 167/92, pulse 78, temperature 98.7 F (37.1 C), resp. rate 15, SpO2 99%. General: Resting in bed, answering and asking questions appropriately . CV: RRR, no rubs, murmurs, or gallops; b/l U and L pulses intact. Pulmonary: Clear lungs bilaterally, in no acute respiratory distress. Abdominal: Soft, non-tender, non-distended; positive bowel sounds.  Neuro: Alert and oriented to self, place, date, and situation. Speech dysarthric, slowed; mostly understandable. CN II-12 otherwise intact with the exception of motor strength on right lower extremity slightly weaker than left lower extremity, slightly decreased strength when spread fingers on right hand pushed together compared to the left.  MSK: Unable to assess gait.  Skin: Warm and  dry, diffusely erythematous skin on anterior lower extremities.  Psych: Normal mood and affect.  Imaging: EKG: personally reviewed my interpretation is sinus rhythm, probable left atrial enlargement, RSR' in V1 or V2. No previous EKG for comparison.   MRI: There is a 2 cm acute infarct in the left corona radiata. No intracranial hemorrhage, mass, midline shift, or extra-axial fluid collection is identified.   Patchy and  confluent T2 hyperintensities in the cerebral white matter bilaterally and in the pons are nonspecific but compatible with extensive chronic small vessel ischemic disease. A chronic lacunar infarct is noted in the right corona radiata. The ventricles and sulci are within normal for age.   Vascular: Major intracranial vascular flow voids are preserved.   CT head w/o contrast: No evidence of acute infarction, hemorrhage, hydrocephalus, extra-axial collection or mass lesion/mass effect. Extensive low-density in the cerebral white matter attributed to chronic small vessel ischemia.  CT Angio head neck w/wo contrast: pending  Echocardiogram: pending     Latest Ref Rng & Units 12/06/2022   10:29 AM  CBC  WBC 4.0 - 10.5 K/uL 5.6   Hemoglobin 12.0 - 15.0 g/dL 74.2   Hematocrit 59.5 - 46.0 % 41.2   Platelets 150 - 400 K/uL 217        Latest Ref Rng & Units 12/06/2022   10:29 AM  CMP  Glucose 70 - 99 mg/dL 96   BUN 8 - 23 mg/dL 14   Creatinine 6.38 - 1.00 mg/dL 7.56   Sodium 433 - 295 mmol/L 138   Potassium 3.5 - 5.1 mmol/L 3.8   Chloride 98 - 111 mmol/L 102   CO2 22 - 32 mmol/L 25   Calcium 8.9 - 10.3 mg/dL 9.2   Total Protein 6.5 - 8.1 g/dL 7.1   Total Bilirubin 0.3 - 1.2 mg/dL 0.7   Alkaline Phos 38 - 126 U/L 74   AST 15 - 41 U/L 27   ALT 0 - 44 U/L 24    CBG 85 A PTT, PT INR, EToH, UDS WNL    Pending labs:  UA in process Lipid panel needs to be collected A1C needs to be collected TSH needs to be collected HIV needs to be collected  Assessment & Plan by Problem: Principal Problem:   CVA (cerebral vascular accident) (HCC) Acute ischemic stroke in left corona radiata  Dysarthria  Patient is a 72 y.o. with a history of depression who was admitted to the ED for speech changes. Does not have regular doctor's visits and is unsure if she has other medical problems like diabetes or hypertension.   Patient with R sided focal weakness in right hand and right lower extremity  (slightly weaker with oppositional force compared to left), an episode of dysphagia, and dysarthric speech. Patient's speech sounds different, but is not currently having difficulties producing coherent speech or understanding spoken speech. SLP can help determine is there is difficulty with word finding or dysphagia. Findings aligned with MRI findings of acute infract in the left corona radiata and extensive chronic small vessel ischemic disease. [facility] Thursday 7/18, patient out of window for TNKase. Neurology consulted and following. Patient will be admitted for stroke/TIA workup.    Plan:  - Permissive HTN for 48 hours goal BP <220/110; PRN labeltalol or hydralazine if BO > 220 systolic or 110 diastolic.  - Avoid PO antihypertensives until permissive HTN period over - Follow up CTA/MRA  - Follow up TTE w/ bubble - Follow up A1c - Follow up LDL +  add statin per guidelines - Started Aspirin 81 mg daily - Started Plavix 75 mg daily antiplt/anticoag - Started acetaminophen 650 mg q6H PRN for mild pain or fever - q4 hr neuro checks - Telemetry  - PT/OT/SLP eval and treat - STAT head CT for any change in neuro exam - Stroke education - Amb referral to neurology upon discharge   - Daily labs, CBC, CMP   Hypertension - Patient not formally diagnosed with hypertension. Currently in a period of permissive HTN for 48 hours with goal BP <220/110. Continue to monitor during hospitalization to determine if needs to be started on antihypertensive before discharge or followed outpatient with PCP.   Depression - Chronic, will resume home dose of Venlafaxine 150 mg   DVT prophylaxis: Lovenox 40 mg subcutaneous Diet: NPO Code: Full   Dispo: Admit patient to Inpatient with expected length of stay greater than 2 midnights.  Signed: Philomena Doheny, MD 12/06/2022, 4:17 PM  Pager: @MYPAGER @ After 5pm on weekdays and 1pm on weekends: On Call pager: [phone]

## 2022-12-06 NOTE — ED Provider Notes (Signed)
[place] EMERGENCY DEPARTMENT AT [facility] Provider Note   CSN: [id number] Arrival date & time: 12/06/22  1001     History  Chief Complaint  Patient presents with   Aphasia         Caitlynne Harbeck is a 72 y.o. female.  Patient is a 72 year old female with a history of depression and anxiety use presenting today by EMS for speech changes.  Patient's last seen well was on Thursday but she noticed yesterday throughout the day she lives alone but maybe her voice sounded funny.  She did have 1 episode of having a hard time swallowing and had a coughing fit but then later was able to eat without difficulty.  She reports waking up this morning and she started talking to herself out loud and noticed her voice sounded unusual.  She has not had any falls, unilateral numbness or weakness.  No visual changes.  She denies any headaches.  She only takes an antidepressant and no other medication.  She also complains of sometimes it is hard to form the words.  The history is provided by the patient and the EMS personnel.       Home Medications Prior to Admission medications   Medication Sig Start Date End Date Taking? Authorizing Provider  Calcium Carbonate-Vit D-Min (CALCIUM 1200 PO) Take by mouth.    [provider]  cholecalciferol (VITAMIN D) 1000 units tablet Take 1,000 Units by mouth daily.    [provider]  clobetasol cream (TEMOVATE) 0.05 % APPLY SPARINGLY TO AFFECTED AREA TWICE A DAY 12/28/17   [provider]  MAGNESIUM PO Take by mouth.    [provider]  OVER THE COUNTER MEDICATION Tumeric    [provider]  venlafaxine XR (EFFEXOR-XR) 150 MG 24 hr capsule Take 150 mg by mouth daily with breakfast.    [provider]      Allergies    Sulfa antibiotics    Review of Systems   Review of Systems  Physical Exam Updated Vital Signs BP (!) 190/106   Pulse 91   Temp 98.7 F (37.1 C)   Resp 19   SpO2 98%   Physical Exam Vitals and nursing note reviewed.  Constitutional:      General: She is not in acute distress.    Appearance: She is well-developed.  HENT:     Head: Normocephalic and atraumatic.  Eyes:     General: No visual field deficit.    Pupils: Pupils are equal, round, and reactive to light.  Cardiovascular:     Rate and Rhythm: Normal rate and regular rhythm.     Heart sounds: Normal heart sounds. No murmur heard.    No friction rub.  Pulmonary:     Effort: Pulmonary effort is normal.     Breath sounds: Normal breath sounds. No wheezing or rales.  Abdominal:     General: Bowel sounds are normal. There is no distension.     Palpations: Abdomen is soft.     Tenderness: There is no abdominal tenderness. There is no guarding or rebound.  Musculoskeletal:        General: No tenderness. Normal range of motion.     Comments: No edema  Skin:    General: Skin is warm and dry.     Findings: No rash.  Neurological:     Mental Status: She is alert and oriented to person, place, and time.     Cranial Nerves: No cranial nerve deficit  or facial asymmetry.     Sensory: Sensation is intact.     Motor: Motor function is intact. No pronator drift.     Coordination: Coordination is intact. Finger-Nose-Finger Test and Heel to [place] Test normal.     Gait: Gait is intact.     Comments: Slurred speech with mild aphasia  Psychiatric:        Behavior: Behavior normal.     ED Results / Procedures / Treatments   Labs (all labs ordered are listed, but only abnormal results are displayed) Labs Reviewed  ETHANOL  PROTIME-INR  APTT  CBC  DIFFERENTIAL  COMPREHENSIVE METABOLIC PANEL  RAPID URINE DRUG SCREEN, HOSP PERFORMED  CBG MONITORING, ED    EKG EKG Interpretation Date/Time:  Saturday December 06 2022 10:09:10 EDT Ventricular Rate:  76 PR Interval:  200 QRS Duration:  103 QT Interval:  415 QTC Calculation: 467 R Axis:   -65  Text Interpretation: Sinus rhythm Probable left atrial  enlargement LAD, consider left anterior fascicular block Low voltage, precordial leads RSR' in V1 or V2, right VCD or RVH No previous tracing Confirmed by Gwyneth Sprout ([id number]) on 12/06/2022 10:16:31 AM  Radiology MR BRAIN WO CONTRAST  Result Date: 12/06/2022 CLINICAL DATA:  Neuro deficit, acute, stroke suspected. Slurred speech. EXAM: MRI HEAD WITHOUT CONTRAST TECHNIQUE: Multiplanar, multiecho pulse sequences of the brain and surrounding structures were obtained without intravenous contrast. COMPARISON:  Head CT 12/06/2022 FINDINGS: Brain: There is a 2 cm acute infarct in the left corona radiata. No intracranial hemorrhage, mass, midline shift, or extra-axial fluid collection is identified. Patchy and confluent T2 hyperintensities in the cerebral white matter bilaterally and in the pons are nonspecific but compatible with extensive chronic small vessel ischemic disease. A chronic lacunar infarct is noted in the right corona radiata. The ventricles and sulci are within normal for age. Vascular: Major intracranial vascular flow voids are preserved. Skull and upper cervical spine: Unremarkable bone marrow signal. Sinuses/Orbits: Unremarkable orbits. Paranasal sinuses and mastoid air cells are clear. Other: None. IMPRESSION: 1. Acute infarct in the left corona radiata. 2. Extensive chronic small vessel ischemic disease. Electronically Signed   By: Sebastian Ache M.D.   On: 12/06/2022 14:28   CT HEAD WO CONTRAST  Result Date: 12/06/2022 CLINICAL DATA:  Aphasia. EXAM: CT HEAD WITHOUT CONTRAST TECHNIQUE: Contiguous axial images were obtained from the base of the skull through the vertex without intravenous contrast. RADIATION DOSE REDUCTION: This exam was performed according to the departmental dose-optimization program which includes automated exposure control, adjustment of the mA and/or kV according to patient size and/or use of iterative reconstruction technique. COMPARISON:  None Available. FINDINGS: Brain:  No evidence of acute infarction, hemorrhage, hydrocephalus, extra-axial collection or mass lesion/mass effect. Extensive low-density in the cerebral white matter attributed to chronic small vessel ischemia. Vascular: No hyperdense vessel or unexpected calcification. Skull: Normal. Negative for fracture or focal lesion. Sinuses/Orbits: No acute finding. IMPRESSION: 1. No acute finding. 2. Chronic small vessel ischemia in the cerebral white matter. Electronically Signed   By: Tiburcio Pea M.D.   On: 12/06/2022 10:36    Procedures Procedures    Medications Ordered in ED Medications  LORazepam (ATIVAN) injection 0.5 mg (0.5 mg Intravenous Given 12/06/22 1338)    ED Course/ Medical Decision Making/ A&P                             Medical Decision Making Amount and/or Complexity of Data Reviewed  Independent Historian: EMS Labs: ordered. Decision-making details documented in ED Course. Radiology: ordered and independent interpretation performed. Decision-making details documented in ED Course. ECG/medicine tests: ordered and independent interpretation performed. Decision-making details documented in ED Course.  Risk Prescription drug management. Decision regarding hospitalization.   Pt  presenting today with a complaint that caries a high risk for morbidity and mortality. Here today with speech changes and hypertension.  Concern for stroke but patient is not in TNKase window as she was last normal on Thursday.  Symptoms are more significant today but something was abnormal yesterday per her report.  Patient does not have a history of hypertension and reports she usually yearly gets a physical with [place] [facility] and has never been told she was hypertensive.  She does not complain of headache or infectious symptoms.  Low suspicion at this time for intracranial hemorrhage, meningitis.  Low suspicion for polypharmacy and patient denies alcohol or drug use.  Stroke order set initiated.  Patient  is hypertensive at this time but will continue to follow. 2:39 PM I independently interpreted patient's labs and EKG.  EKG without acute findings and no evidence of dysrhythmia.  EtOH, UDS, CBC, CMP all within normal limits.  I have independently visualized and interpreted pt's images today.  Head CT without evidence of acute bleed.  Radiology reports no acute findings but chronic small vessel ischemia in the cerebral white matter.  Consulted with neurology and Dr. Derry Lory and he recommended MRI before disposition. 2:39 PM MRI shows stroke and patient will be admitted for workup.  She requires admission and hospitalist consulted.  Neurology to consult.         Final Clinical Impression(s) / ED Diagnoses Final diagnoses:  Cerebrovascular accident (CVA), unspecified mechanism (HCC)    Rx / DC Orders ED Discharge Orders     None         Gwyneth Sprout, MD 12/06/22 1439

## 2022-12-06 NOTE — ED Notes (Signed)
Patient transported to MRI 

## 2022-12-06 NOTE — Hospital Course (Addendum)
Patient is a 72 y.o. with a history of depression who was admitted to the ED on 7/20 for speech changes. Only prescribed medication was venlafaxine at bedtime.   [facility] Thursday 7/18. Patient lives alone and reports that she noticed speech/swallowing changes yesterday. Per patient, had one episode of choking on coffee in the morning on 7/19. Didn't think much of it. While watching TV, noticed she had difficulty repeating things accurately, voice sounded different. Woke up on 7/20, drank a few sips of coffee with no further choking, but became concerned when voice seemed slower and different than usual and called EMS. Endorses slight headache on admission but thinks it may be due to caffeine withdrawal since she limited her usual coffee intake to a few sips. Radene Journey stated patient's voice is slower and different from baseline. Patient denies falls, numbness, weakness, visual changes, or difficulties ambulating. Denies urinary incontinence or constipation.   CVA Acute ischemic stroke in left corona radiata  Dysarthria On admission, patient presented with R sided focal weakness in right hand and right lower extremity (slightly weaker with oppositional force compared to left), an episode of dysphagia, and dysarthric speech. Patient out of window for [facility] given [place] Thursday 7/18. Neurology was consulted and patient was admitted for stroke/TIA workup.   CT head w/o contrast and MR brain w/o contrast showed acute infarct in the left corona radiata and extensive chronic small vessel ischemic disease. Patient was started on aspirin 81 and plavix 7. Patient's blood pressures remained elevated throughout period of permissive hypertension but did not require intervention with labetalol or hydralazine.    Patient's R sided weakness in hand strength, LE strength, and slowed coordination were stable on day one of admission. Blood pressures started to normalize. Speech continued to be dysarthric. SLP initially  recommended acute inpatient rehab and a modified swallow study today given some trouble with bedside swallow study. TTE w/ bubble was performed, LVEF of 70 to 75% , no LV regional wall motion abnormalities (see full findings below). Patient was continued on aspirin and Plavix, added on atorvastatin 80 to prevent stroke (Cr. 0.81).     Pre-diabetes monitoring A1C 5.6, recommend follow up outpatient for routine monitoring.    Hypertension Patient not formally diagnosed with hypertension, blood pressures intermittently low late first day of hospitalization but normalized to 120s-130s/70s-80s.    Depression Chronic, resumed home dose of Venlafaxine 37.5 mg. Monitor outpatient to determine if dose is appropriate or if patient desires therapy referral.

## 2022-12-06 NOTE — Consult Note (Signed)
Neurology Consultation  Reason for Consult: Acute stroke in the left corona radiata Referring Physician: Dr. Criselda Peaches  CC: Dysarthria  History is obtained from: Patient and chart  HPI: Martha Spencer is a 72 y.o. female with history of anxiety and depression who presented earlier today with dysarthria.  She noted yesterday that her speech was changed and had 1 episode of coughing and difficulty swallowing.  She feels that her voice sounds unusual but has noticed no other changes except possibly diminished fine motor control in the right hand.   LKW: 7/18, uncertain time TNK given?: no, outside of window IR Thrombectomy? No, no LVO Modified Rankin Scale: 0-Completely asymptomatic and back to baseline post- stroke  ROS: A complete ROS was performed and is negative except as noted in the HPI.   Past Medical History:  Diagnosis Date   Anxiety    Depression    GERD (gastroesophageal reflux disease)    occ     Family History  Problem Relation Age of Onset   Colon cancer Neg Hx    Colon polyps Neg Hx    Esophageal cancer Neg Hx    Rectal cancer Neg Hx    Stomach cancer Neg Hx      Social History:   reports that she has never smoked. She has never used smokeless tobacco. She reports that she does not drink alcohol and does not use drugs.  Medications No current facility-administered medications for this encounter.  Current Outpatient Medications:    Calcium Carbonate-Vit D-Min (CALCIUM 1200 PO), Take by mouth., Disp: , Rfl:    cholecalciferol (VITAMIN D) 1000 units tablet, Take 1,000 Units by mouth daily., Disp: , Rfl:    clobetasol cream (TEMOVATE) 0.05 %, APPLY SPARINGLY TO AFFECTED AREA TWICE A DAY, Disp: , Rfl: 0   MAGNESIUM PO, Take by mouth., Disp: , Rfl:    OVER THE COUNTER MEDICATION, Tumeric, Disp: , Rfl:    venlafaxine XR (EFFEXOR-XR) 150 MG 24 hr capsule, Take 150 mg by mouth daily with breakfast., Disp: , Rfl:    Exam: Current vital signs: BP (!) 167/92   Pulse  78   Temp 98.7 F (37.1 C)   Resp 15   SpO2 99%  Vital signs in last 24 hours: Temp:  [98.7 F (37.1 C)] 98.7 F (37.1 C) (07/20 1409) Pulse Rate:  [73-91] 78 (07/20 1445) Resp:  [10-20] 15 (07/20 1445) BP: (167-200)/(92-106) 167/92 (07/20 1445) SpO2:  [98 %-100 %] 99 % (07/20 1445)  GENERAL: Awake, alert, in no acute distress Psych: Affect appropriate for situation, patient is calm and cooperative with examination Head: Normocephalic and atraumatic, without obvious abnormality EENT: Normal conjunctivae, moist mucous membranes, no OP obstruction LUNGS: Normal respiratory effort. Non-labored breathing on room air CV: Regular rate and rhythm on telemetry Extremities: warm, well perfused, without obvious deformity  NEURO:  Mental Status: Awake, alert, and oriented to person, place, time, and situation. She is able to provide a clear and coherent history of present illness. Speech/Language: speech is slightly dysarthric.   Naming, repetition, fluency, and comprehension intact without aphasia  No neglect is noted Cranial Nerves:  II: PERRL visual fields full.  III, IV, VI: EOMI. Lid elevation symmetric and full.  V: Sensation is intact to light touch and symmetrical to face.  VII: Face is symmetric resting and smiling.  VIII: Hearing intact to voice IX, X: Voice is dysarthric XI: Normal sternocleidomastoid and trapezius muscle strength XII: Tongue protrudes midline without fasciculations.   Motor: 5/5 strength  is all muscle groups.  Slightly diminished fine finger movements in right hand Tone is normal. Bulk is normal.  Sensation: Intact to light touch bilaterally in all four extremities. No extinction to DSS present.  Coordination: FTN intact bilaterally. No pronator drift. Alternating hand movements.  Gait: Deferred  NIHSS: 1a Level of Conscious.: 0 1b LOC Questions: 0 1c LOC Commands: 0 2 Best Gaze: 0 3 Visual: 0 4 Facial Palsy: 0 5a Motor Arm - left: 0 5b Motor Arm -  Right: 0 6a Motor Leg - Left: 0 6b Motor Leg - Right: 0 7 Limb Ataxia: 0 8 Sensory: 0 9 Best Language: 0 10 Dysarthria: 1 11 Extinct. and Inatten.: 0 TOTAL: 1   Labs I have reviewed labs in epic and the results pertinent to this consultation are:   CBC    Component Value Date/Time   WBC 5.6 12/06/2022 1029   RBC 4.41 12/06/2022 1029   HGB 13.8 12/06/2022 1029   HCT 41.2 12/06/2022 1029   PLT 217 12/06/2022 1029   MCV 93.4 12/06/2022 1029   MCH 31.3 12/06/2022 1029   MCHC 33.5 12/06/2022 1029   RDW 12.4 12/06/2022 1029   LYMPHSABS 1.5 12/06/2022 1029   MONOABS 0.6 12/06/2022 1029   EOSABS 0.2 12/06/2022 1029   BASOSABS 0.1 12/06/2022 1029    CMP     Component Value Date/Time   NA 138 12/06/2022 1029   K 3.8 12/06/2022 1029   CL 102 12/06/2022 1029   CO2 25 12/06/2022 1029   GLUCOSE 96 12/06/2022 1029   BUN 14 12/06/2022 1029   CREATININE 0.86 12/06/2022 1029   CALCIUM 9.2 12/06/2022 1029   PROT 7.1 12/06/2022 1029   ALBUMIN 4.1 12/06/2022 1029   AST 27 12/06/2022 1029   ALT 24 12/06/2022 1029   ALKPHOS 74 12/06/2022 1029   BILITOT 0.7 12/06/2022 1029   GFRNONAA >60 12/06/2022 1029    Lipid Panel  No results found for: "CHOL", "TRIG", "HDL", "CHOLHDL", "VLDL", "LDLCALC", "LDLDIRECT"   Imaging I have reviewed the images obtained:  CT-scan of the brain: No acute abnormality  MRI examination of the brain Acute infarct in left corona radiata and extensive chronic small vessel ischemic disease  Assessment: 72 year old patient with history of anxiety and depression presents with dysarthria but has been present for about a day as well as possibly diminished fine motor control of the right hand.  She works as a Data processing manager.  She lives alone and first noted changes in her voice yesterday and presented to the ED for evaluation.  MRI showed small acute infarct in left corona radiata.  Patient verbalizes some concerns about diminished fine finger movements on  the right given her profession.  Encouraged patient to use right hand as much as possible for tasks requiring fine motor control, to play her cello and to discuss concerns with OT.  Impression: Acute ischemic stroke in left corona radiata  Recommendations: Stroke/TIA Workup  - Admit for stroke workup - Permissive HTN x48 hrs from sx onset goal BP <220/110. PRN labetalol or hydralazine if BP above these parameters. Avoid oral antihypertensives. - CTA/MRA if not already obtained - TTE w/ bubble - Check A1c and LDL + add statin per guidelines - Aspirin 81 mg daily and Plavix 75 mg daily antiplt/anticoag - q4 hr neuro checks - STAT head CT for any change in neuro exam - Tele - PT/OT/SLP - Stroke education - Amb referral to neurology upon discharge    Pt seen by  NP/Neuro and later by MD. Note/plan to be edited by MD as needed.  Martha Spencer , MSN, AGACNP-BC [facility] See Amion for schedule and pager information 12/06/2022 3:24 PM  NEUROHOSPITALIST ADDENDUM Performed a face to face diagnostic evaluation.   I have reviewed the contents of history and physical exam as documented by PA/ARNP/Resident and agree with above documentation.  I have discussed and formulated the above plan as documented. Edits to the note have been made as needed.  Impression/Key exam findings/Plan: 49F p/w dysarthric speech with reduced dexterity in RUE. Likely explained by the noted L corona radiata stroke. Will need stroke workup. Stroke appears somewhat large for a small vessel stroke, ?embolic. Will need full stroke workup to determine etiology of her stroke.  Erick Blinks, MD [facility] [phone]   If 7pm to 7am, please call on call as listed on AMION.

## 2022-12-06 NOTE — ED Notes (Signed)
Patient ambulatory to bathroom with no difficulty.

## 2022-12-06 NOTE — ED Notes (Signed)
Patient ambulatory to restroom without assistance but supervised.

## 2022-12-06 NOTE — ED Notes (Signed)
Patient transported to CT 

## 2022-12-06 NOTE — ED Triage Notes (Signed)
TRIAGE NOTE:   Patient arrives by EMS from home with c/o slurred speech.   Patient reports last spoke on Thursday, but reports yesterday morning had difficulty swallowing her coffee, but reports has eaten and drinking since then without difficulty.   Patient denies headache, blurred vision, numbness and tingling.    EMS VITALS BP 194/118  Patient reports no history of high blood pressure.  CBG 96

## 2022-12-07 ENCOUNTER — Observation Stay (HOSPITAL_BASED_OUTPATIENT_CLINIC_OR_DEPARTMENT_OTHER): Payer: Medicare Other

## 2022-12-07 DIAGNOSIS — I6381 Other cerebral infarction due to occlusion or stenosis of small artery: Secondary | ICD-10-CM

## 2022-12-07 DIAGNOSIS — I341 Nonrheumatic mitral (valve) prolapse: Secondary | ICD-10-CM | POA: Diagnosis not present

## 2022-12-07 LAB — CBC
HCT: 41.6 % (ref 36.0–46.0)
Hemoglobin: 13.8 g/dL (ref 12.0–15.0)
MCH: 30.1 pg (ref 26.0–34.0)
MCHC: 33.2 g/dL (ref 30.0–36.0)
MCV: 90.6 fL (ref 80.0–100.0)
Platelets: 230 10*3/uL (ref 150–400)
RBC: 4.59 MIL/uL (ref 3.87–5.11)
RDW: 12.5 % (ref 11.5–15.5)
WBC: 7.7 10*3/uL (ref 4.0–10.5)
nRBC: 0 % (ref 0.0–0.2)

## 2022-12-07 LAB — ECHOCARDIOGRAM COMPLETE
AR max vel: 2.68 cm2
AV Area VTI: 2.48 cm2
AV Area mean vel: 2.32 cm2
AV Mean grad: 3 mmHg
AV Peak grad: 4.5 mmHg
Ao pk vel: 1.06 m/s
Area-P 1/2: 3 cm2
P 1/2 time: 604 msec
S' Lateral: 2.1 cm

## 2022-12-07 LAB — COMPREHENSIVE METABOLIC PANEL
ALT: 23 U/L (ref 0–44)
AST: 23 U/L (ref 15–41)
Albumin: 4.1 g/dL (ref 3.5–5.0)
Alkaline Phosphatase: 78 U/L (ref 38–126)
Anion gap: 9 (ref 5–15)
BUN: 13 mg/dL (ref 8–23)
CO2: 23 mmol/L (ref 22–32)
Calcium: 9 mg/dL (ref 8.9–10.3)
Chloride: 103 mmol/L (ref 98–111)
Creatinine, Ser: 0.81 mg/dL (ref 0.44–1.00)
GFR, Estimated: >60 mL/min (ref >60)
Glucose, Bld: 114 mg/dL — ABNORMAL HIGH (ref 70–99)
Potassium: 3.5 mmol/L (ref 3.5–5.1)
Sodium: 135 mmol/L (ref 135–145)
Total Bilirubin: 0.9 mg/dL (ref 0.3–1.2)
Total Protein: 6.9 g/dL (ref 6.5–8.1)

## 2022-12-07 LAB — LIPID PANEL
Cholesterol: 214 mg/dL — ABNORMAL HIGH (ref 0–200)
HDL: 49 mg/dL (ref >40)
LDL Cholesterol: 139 mg/dL — ABNORMAL HIGH (ref 0–99)
Total CHOL/HDL Ratio: 4.4 RATIO
Triglycerides: 130 mg/dL (ref <150)
VLDL: 26 mg/dL (ref 0–40)

## 2022-12-07 MED ORDER — VENLAFAXINE HCL ER 37.5 MG PO CP24
37.5000 mg | ORAL_CAPSULE | Freq: Every day | ORAL | Status: DC
  Filled 2022-12-07: qty 1

## 2022-12-07 MED ORDER — ATORVASTATIN CALCIUM 80 MG PO TABS
80.0000 mg | ORAL_TABLET | Freq: Every day | ORAL | Status: DC
  Administered 2022-12-07 – 2022-12-08 (×2): 80 mg via ORAL
  Filled 2022-12-07 (×2): qty 1

## 2022-12-07 MED ORDER — VENLAFAXINE HCL ER 37.5 MG PO CP24
37.5000 mg | ORAL_CAPSULE | Freq: Every day | ORAL | Status: DC
  Administered 2022-12-07: 37.5 mg via ORAL
  Filled 2022-12-07 (×2): qty 1

## 2022-12-07 MED ORDER — FOOD THICKENER (SIMPLYTHICK HONEY): 1.0000 | ORAL | Status: DC | PRN

## 2022-12-07 NOTE — Evaluation (Signed)
Physical Therapy Evaluation Patient Details Name: Martha Spencer MRN: [id number] DOB: 20-Dec-1950 Today's Date: 12/07/2022  History of Present Illness  Pt is a 72 y.o. female admitted 7/20 with c/o slurred speech. 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 PMH: GERD, anxiety, depression   Clinical Impression  Pt admitted with above diagnosis. PTA pt lived alone, independent.  Pt currently with functional limitations due to the deficits listed below (see PT Problem List). On eval, she demonstrated independence with bed mobility and transfers. Supervision provided for amb 250' without AD. Steady gait noted. Pt educated on BEFAST. Pt will benefit from acute skilled PT to increase their independence and safety with mobility to allow discharge.  PT to follow acutely to further assess higher level balance tasks and stairs. No follow up PT services indicated. No DME needs.          Assistance Recommended at Discharge PRN  If plan is discharge home, recommend the following:  Can travel by private vehicle           Equipment Recommendations None recommended by PT  Recommendations for Other Services       Functional Status Assessment Patient has had a recent decline in their functional status and demonstrates the ability to make significant improvements in function in a reasonable and predictable amount of time.     Precautions / Restrictions Precautions Precautions: None      Mobility  Bed Mobility Overal bed mobility: Independent                  Transfers Overall transfer level: Independent Equipment used: None                    Ambulation/Gait Ambulation/Gait assistance: Supervision Gait Distance (Feet): 250 Feet Assistive device: None Gait Pattern/deviations: WFL(Within Functional Limits) Gait velocity: WFL Gait velocity interpretation: >2.62 ft/sec, indicative of community ambulatory   General Gait Details: steady gait. Supervision for  safety  Stairs            Wheelchair Mobility     Tilt Bed    Modified Rankin (Stroke Patients Only) Modified Rankin (Stroke Patients Only) Pre-Morbid Rankin Score: No symptoms Modified Rankin: No significant disability     Balance Overall balance assessment: No apparent balance deficits (not formally assessed)                                           Pertinent Vitals/Pain Pain Assessment Pain Assessment: No/denies pain    Home Living Family/patient expects to be discharged to:: Private residence Living Arrangements: Alone Available Help at Discharge: Friend(s);Available PRN/intermittently Type of Home: Apartment (3rd floor) Home Access: Elevator;Stairs to enter   Entrance Stairs-Number of Steps: 2 flights   Home Layout: One level Home Equipment: None      Prior Function Prior Level of Function : Independent/Modified Independent;Driving                     Hand Dominance   Dominant Hand: Right    Extremity/Trunk Assessment   Upper Extremity Assessment Upper Extremity Assessment: Defer to OT evaluation    Lower Extremity Assessment Lower Extremity Assessment: Overall WFL for tasks assessed    Cervical / Trunk Assessment Cervical / Trunk Assessment: Normal  Communication      Cognition Arousal/Alertness: Awake/alert Behavior During Therapy: WFL for tasks assessed/performed Overall Cognitive  Status: Within Functional Limits for tasks assessed                                 General Comments: tearful regarding new stroke dx        General Comments General comments (skin integrity, edema, etc.): educated on BEFAST. VSS on RA    Exercises     Assessment/Plan    PT Assessment Patient needs continued PT services  PT Problem List Decreased mobility       PT Treatment Interventions Functional mobility training;Gait training;Balance training;Patient/family education;Therapeutic activities;Stair  training;Therapeutic exercise    PT Goals (Current goals can be found in the Care Plan section)  Acute Rehab PT Goals Patient Stated Goal: home PT Goal Formulation: With patient Time For Goal Achievement: 12/14/22 Potential to Achieve Goals: Good    Frequency Min 3X/week     Co-evaluation               AM-PAC PT "6 Clicks" Mobility  Outcome Measure Help needed turning from your back to your side while in a flat bed without using bedrails?: None Help needed moving from lying on your back to sitting on the side of a flat bed without using bedrails?: None Help needed moving to and from a bed to a chair (including a wheelchair)?: None Help needed standing up from a chair using your arms (e.g., wheelchair or bedside chair)?: None Help needed to walk in hospital room?: A Little Help needed climbing 3-5 steps with a railing? : A Little 6 Click Score: 22    End of Session Equipment Utilized During Treatment: Gait belt Activity Tolerance: Patient tolerated treatment well Patient left: in bed;with call bell/phone within reach Nurse Communication: Mobility status PT Visit Diagnosis: Other symptoms and signs involving the nervous system (R29.898)    Time: 1610-9604 PT Time Calculation (min) (ACUTE ONLY): 21 min   Charges:   PT Evaluation $PT Eval Moderate Complexity: 1 Mod   PT General Charges $$ ACUTE PT VISIT: 1 Visit         Ferd Glassing., PT  Office # [phone]   Ilda Foil 12/07/2022, 12:59 PM

## 2022-12-07 NOTE — ED Notes (Signed)
Drinks w/o straws and meds in applesauce

## 2022-12-07 NOTE — ED Notes (Signed)
ED TO INPATIENT HANDOFF REPORT  ED Nurse Name and Phone #: Denton Ar RN [phone]  S Name/Age/Gender Martha Spencer 72 y.o. female Room/Bed: 038C/038C  Code Status   Code Status: Full Code  Home/SNF/Other Home Patient oriented to: self, place, time, and situation Is this baseline? Yes   Triage Complete: Triage complete  Chief Complaint CVA (cerebral vascular accident) [facility]) [I63.9]  Triage Note TRIAGE NOTE:   Patient arrives by EMS from home with c/o slurred speech.   Patient reports last spoke on Thursday, but reports yesterday morning had difficulty swallowing her coffee, but reports has eaten and drinking since then without difficulty.   Patient denies headache, blurred vision, numbness and tingling.    EMS VITALS BP 194/118  Patient reports no history of high blood pressure.  CBG 96    Allergies Allergies  Allergen Reactions   Sulfa Antibiotics     Unknown reaction   Wheat Other (See Comments)    Glutton Free    Level of Care/Admitting Diagnosis ED Disposition     ED Disposition  Admit   Condition  --   Comment  Hospital Area: [facility] [[id number]]  Level of Care: Telemetry Medical [104]  May place patient in observation at [facility] or [facility] if equivalent level of care is available:: No  Covid Evaluation: Asymptomatic - no recent exposure (last 10 days) testing not required  Diagnosis: CVA (cerebral vascular accident) [facility]) [[id number]]  Admitting Physician: Inez Catalina [phone]  Attending Physician: Nena Polio          B Medical/Surgery History Past Medical History:  Diagnosis Date   Anxiety    Depression    GERD (gastroesophageal reflux disease)    occ   Past Surgical History:  Procedure Laterality Date   ADENOIDECTOMY     COLONOSCOPY  2009   Dr Virginia Rochester -poor prep    EYE SURGERY     OOPHORECTOMY     WISDOM TOOTH EXTRACTION       A IV Location/Drains/Wounds Patient Lines/Drains/Airways Status      Active Line/Drains/Airways     Name Placement date Placement time Site Days   Peripheral IV 12/06/22 20 G Left Antecubital 12/06/22  --  Antecubital  1            Intake/Output Last 24 hours  Intake/Output Summary (Last 24 hours) at 12/07/2022 1144 Last data filed at 12/07/2022 1041 Gross per 24 hour  Intake 30 ml  Output --  Net 30 ml    Labs/Imaging Results for orders placed or performed during the hospital encounter of 12/06/22 (from the past 48 hour(s))  CBG monitoring, ED     Status: None   Collection Time: 12/06/22 10:10 AM  Result Value Ref Range   Glucose-Capillary 85 70 - 99 mg/dL    Comment: Glucose reference range applies only to samples taken after fasting for at least 8 hours.  Ethanol     Status: None   Collection Time: 12/06/22 10:29 AM  Result Value Ref Range   Alcohol, Ethyl (B) <10 <10 mg/dL    Comment: (NOTE) Lowest detectable limit for serum alcohol is 10 mg/dL.  For medical purposes only. Performed at [facility], [address] [place], [place] [address]   Protime-INR     Status: None   Collection Time: 12/06/22 10:29 AM  Result Value Ref Range   Prothrombin Time 14.1 11.4 - 15.2 seconds   INR 1.1 0.8 - 1.2  Comment: (NOTE) INR goal varies based on device and disease states. Performed at [facility], [address] [place], [place] [address]   APTT     Status: None   Collection Time: 12/06/22 10:29 AM  Result Value Ref Range   aPTT 30 24 - 36 seconds    Comment: Performed at [facility], [address] [place], [place] [address]  CBC     Status: None   Collection Time: 12/06/22 10:29 AM  Result Value Ref Range   WBC 5.6 4.0 - 10.5 K/uL   RBC 4.41 3.87 - 5.11 MIL/uL   Hemoglobin 13.8 12.0 - 15.0 g/dL   HCT 27.2 53.6 - 64.4 %   MCV 93.4 80.0 - 100.0 fL   MCH 31.3 26.0 - 34.0 pg   MCHC 33.5 30.0 - 36.0 g/dL   RDW 03.4 74.2 - 59.5 %   Platelets 217 150 - 400 K/uL   nRBC 0.0 0.0 - 0.2 %    Comment: Performed  at [facility], [address], [place], [place] [address]  Differential     Status: None   Collection Time: 12/06/22 10:29 AM  Result Value Ref Range   Neutrophils Relative % 58 %   Neutro Abs 3.2 1.7 - 7.7 K/uL   Lymphocytes Relative 27 %   Lymphs Abs 1.5 0.7 - 4.0 K/uL   Monocytes Relative 10 %   Monocytes Absolute 0.6 0.1 - 1.0 K/uL   Eosinophils Relative 4 %   Eosinophils Absolute 0.2 0.0 - 0.5 K/uL   Basophils Relative 1 %   Basophils Absolute 0.1 0.0 - 0.1 K/uL   Immature Granulocytes 0 %   Abs Immature Granulocytes 0.01 0.00 - 0.07 K/uL    Comment: Performed at [facility], [address] [place], [place] [address]  Comprehensive metabolic panel     Status: None   Collection Time: 12/06/22 10:29 AM  Result Value Ref Range   Sodium 138 135 - 145 mmol/L   Potassium 3.8 3.5 - 5.1 mmol/L   Chloride 102 98 - 111 mmol/L   CO2 25 22 - 32 mmol/L   Glucose, Bld 96 70 - 99 mg/dL    Comment: Glucose reference range applies only to samples taken after fasting for at least 8 hours.   BUN 14 8 - 23 mg/dL   Creatinine, Ser 9.51 0.44 - 1.00 mg/dL   Calcium 9.2 8.9 - 88.4 mg/dL   Total Protein 7.1 6.5 - 8.1 g/dL   Albumin 4.1 3.5 - 5.0 g/dL   AST 27 15 - 41 U/L   ALT 24 0 - 44 U/L   Alkaline Phosphatase 74 38 - 126 U/L   Total Bilirubin 0.7 0.3 - 1.2 mg/dL   GFR, Estimated >16 >60 mL/min    Comment: (NOTE) Calculated using the CKD-EPI Creatinine Equation (2021)    Anion gap 11 5 - 15    Comment: Performed at [facility], [address] [place], [place] [address]  Urine rapid drug screen (hosp performed)     Status: None   Collection Time: 12/06/22 11:00 AM  Result Value Ref Range   Opiates NONE DETECTED NONE DETECTED   Cocaine NONE DETECTED NONE DETECTED   Benzodiazepines NONE DETECTED NONE DETECTED   Amphetamines NONE DETECTED NONE DETECTED   Tetrahydrocannabinol NONE DETECTED NONE DETECTED   Barbiturates NONE DETECTED NONE DETECTED    Comment:  (NOTE) DRUG SCREEN FOR MEDICAL PURPOSES ONLY.  IF CONFIRMATION IS NEEDED FOR ANY PURPOSE, NOTIFY LAB WITHIN 5 DAYS.  LOWEST DETECTABLE LIMITS FOR URINE DRUG SCREEN Drug Class                     Cutoff (ng/mL) Amphetamine and metabolites    1000 Barbiturate and metabolites    200 Benzodiazepine                 200 Opiates and metabolites        300 Cocaine and metabolites        300 THC                            50 Performed at [facility], [address] [place], [place] [address]   Urinalysis, Complete w Microscopic -Urine, Clean Catch     Status: Abnormal   Collection Time: 12/06/22  4:20 PM  Result Value Ref Range   Color, Urine STRAW (A) YELLOW   APPearance CLEAR CLEAR   Specific Gravity, Urine 1.010 1.005 - 1.030   pH 8.0 5.0 - 8.0   Glucose, UA NEGATIVE NEGATIVE mg/dL   Hgb urine dipstick NEGATIVE NEGATIVE   Bilirubin Urine NEGATIVE NEGATIVE   Ketones, ur NEGATIVE NEGATIVE mg/dL   Protein, ur 30 (A) NEGATIVE mg/dL   Nitrite NEGATIVE NEGATIVE   Leukocytes,Ua NEGATIVE NEGATIVE   RBC / HPF 6-10 0 - 5 RBC/hpf   WBC, UA 0-5 0 - 5 WBC/hpf   Bacteria, UA NONE SEEN NONE SEEN   Squamous Epithelial / HPF 0-5 0 - 5 /HPF    Comment: Performed at [facility], [address] [place], [place] [address]  HIV Antibody (routine testing w rflx)     Status: None   Collection Time: 12/06/22 10:20 PM  Result Value Ref Range   HIV Screen 4th Generation wRfx Non Reactive Non Reactive    Comment: Performed at [facility], [address], [place], [place] [address]  TSH     Status: Abnormal   Collection Time: 12/06/22 10:20 PM  Result Value Ref Range   TSH 4.530 (H) 0.350 - 4.500 uIU/mL    Comment: Performed by a 3rd Generation assay with a functional sensitivity of <=0.01 uIU/mL. Performed at [facility], [address], [place], [place] [address]   Hemoglobin A1c     Status: None   Collection Time: 12/06/22 10:20 PM  Result Value Ref Range   Hgb A1c  MFr Bld 5.6 4.8 - 5.6 %    Comment: (NOTE) Pre diabetes:          5.7%-6.4%  Diabetes:              >6.4%  Glycemic control for   <7.0% adults with diabetes    Mean Plasma Glucose 114.02 mg/dL    Comment: Performed at [facility], [address], [place], [place] [address]  Comprehensive metabolic panel     Status: Abnormal   Collection Time: 12/07/22  3:45 AM  Result Value Ref Range   Sodium 135 135 - 145 mmol/L   Potassium 3.5 3.5 - 5.1 mmol/L   Chloride 103 98 - 111 mmol/L   CO2 23 22 - 32 mmol/L   Glucose, Bld 114 (H) 70 - 99 mg/dL    Comment: Glucose reference range applies only to samples taken after fasting for at least 8 hours.   BUN 13 8 -  23 mg/dL   Creatinine, Ser 5.28 0.44 - 1.00 mg/dL   Calcium 9.0 8.9 - 41.3 mg/dL   Total Protein 6.9 6.5 - 8.1 g/dL   Albumin 4.1 3.5 - 5.0 g/dL   AST 23 15 - 41 U/L   ALT 23 0 - 44 U/L   Alkaline Phosphatase 78 38 - 126 U/L   Total Bilirubin 0.9 0.3 - 1.2 mg/dL   GFR, Estimated >24 >40 mL/min    Comment: (NOTE) Calculated using the CKD-EPI Creatinine Equation (2021)    Anion gap 9 5 - 15    Comment: Performed at [facility], [address] [place], [place] [address]  CBC     Status: None   Collection Time: 12/07/22  3:45 AM  Result Value Ref Range   WBC 7.7 4.0 - 10.5 K/uL   RBC 4.59 3.87 - 5.11 MIL/uL   Hemoglobin 13.8 12.0 - 15.0 g/dL   HCT 53.6 64.4 - 03.4 %   MCV 90.6 80.0 - 100.0 fL   MCH 30.1 26.0 - 34.0 pg   MCHC 33.2 30.0 - 36.0 g/dL   RDW 74.2 59.5 - 63.8 %   Platelets 230 150 - 400 K/uL   nRBC 0.0 0.0 - 0.2 %    Comment: Performed at [facility], [address] [place], [place] [address]  Lipid panel     Status: Abnormal   Collection Time: 12/07/22  3:45 AM  Result Value Ref Range   Cholesterol 214 (H) 0 - 200 mg/dL   Triglycerides 329 <518 mg/dL   HDL 49 >84 mg/dL   Total CHOL/HDL Ratio 4.4 RATIO   VLDL 26 0 - 40 mg/dL   LDL Cholesterol 166 (H) 0 - 99 mg/dL    Comment:         Total Cholesterol/HDL:CHD Risk Coronary Heart Disease Risk Table                     Men   Women  1/2 Average Risk   3.4   3.3  Average Risk       5.0   4.4  2 X Average Risk   9.6   7.1  3 X Average Risk  23.4   11.0        Use the calculated Patient Ratio above and the CHD Risk Table to determine the patient's CHD Risk.        ATP III CLASSIFICATION (LDL):  <100     mg/dL   Optimal  063-016  mg/dL   Near or Above                    Optimal  130-159  mg/dL   Borderline  010-932  mg/dL   High  >355     mg/dL   Very High Performed at [facility], [address] [place], [place] [address]    CT ANGIO HEAD NECK W WO CM  Result Date: 12/06/2022 CLINICAL DATA:  Stroke/TIA, determine embolic source EXAM: CT ANGIOGRAPHY HEAD AND NECK WITH AND WITHOUT CONTRAST TECHNIQUE: Multidetector CT imaging of the head and neck was performed using the standard protocol during bolus administration of intravenous contrast. Multiplanar CT image reconstructions and MIPs were obtained to evaluate the vascular anatomy. Carotid stenosis measurements (when applicable) are obtained utilizing NASCET criteria, using the distal internal carotid diameter as the denominator. RADIATION DOSE REDUCTION: This exam was performed according to the departmental dose-optimization program which includes  automated exposure control, adjustment of the mA and/or kV according to patient size and/or use of iterative reconstruction technique. CONTRAST:  75mL OMNIPAQUE IOHEXOL 350 MG/ML SOLN COMPARISON:  CT Head 12/06/22, MR head 12/06/22 FINDINGS: CT HEAD FINDINGS See same day CT head for intracranial findings. No contrast-enhancing lesion is visualized. CTA NECK FINDINGS Aortic arch: Standard branching. Imaged portion shows no evidence of aneurysm or dissection. No significant stenosis of the major arch vessel origins. Right carotid system: No evidence of dissection, stenosis (50% or greater), or occlusion. Left carotid system: No  evidence of dissection, stenosis (50% or greater), or occlusion. Vertebral arteries: Codominant. No evidence of dissection, stenosis (50% or greater), or occlusion. Skeleton: Negative. Other neck: Negative. Upper chest: Negative. Review of the MIP images confirms the above findings CTA HEAD FINDINGS Anterior circulation: No significant stenosis, proximal occlusion, aneurysm, or vascular malformation. Posterior circulation: No significant stenosis, proximal occlusion, aneurysm, or vascular malformation. Venous sinuses: As permitted by contrast timing, patent. Anatomic variants: None Review of the MIP images confirms the above findings IMPRESSION: 1. No intracranial large vessel occlusion or significant stenosis. 2. No hemodynamically significant stenosis in the neck. 3. See same day CT head and brain MRI for intracranial findings. Electronically Signed   By: Lorenza Cambridge M.D.   On: 12/06/2022 17:30   MR BRAIN WO CONTRAST  Result Date: 12/06/2022 CLINICAL DATA:  Neuro deficit, acute, stroke suspected. Slurred speech. EXAM: MRI HEAD WITHOUT CONTRAST TECHNIQUE: Multiplanar, multiecho pulse sequences of the brain and surrounding structures were obtained without intravenous contrast. COMPARISON:  Head CT 12/06/2022 FINDINGS: Brain: There is a 2 cm acute infarct in the left corona radiata. No intracranial hemorrhage, mass, midline shift, or extra-axial fluid collection is identified. Patchy and confluent T2 hyperintensities in the cerebral white matter bilaterally and in the pons are nonspecific but compatible with extensive chronic small vessel ischemic disease. A chronic lacunar infarct is noted in the right corona radiata. The ventricles and sulci are within normal for age. Vascular: Major intracranial vascular flow voids are preserved. Skull and upper cervical spine: Unremarkable bone marrow signal. Sinuses/Orbits: Unremarkable orbits. Paranasal sinuses and mastoid air cells are clear. Other: None. IMPRESSION: 1.  Acute infarct in the left corona radiata. 2. Extensive chronic small vessel ischemic disease. Electronically Signed   By: Sebastian Ache M.D.   On: 12/06/2022 14:28   CT HEAD WO CONTRAST  Result Date: 12/06/2022 CLINICAL DATA:  Aphasia. EXAM: CT HEAD WITHOUT CONTRAST TECHNIQUE: Contiguous axial images were obtained from the base of the skull through the vertex without intravenous contrast. RADIATION DOSE REDUCTION: This exam was performed according to the departmental dose-optimization program which includes automated exposure control, adjustment of the mA and/or kV according to patient size and/or use of iterative reconstruction technique. COMPARISON:  None Available. FINDINGS: Brain: No evidence of acute infarction, hemorrhage, hydrocephalus, extra-axial collection or mass lesion/mass effect. Extensive low-density in the cerebral white matter attributed to chronic small vessel ischemia. Vascular: No hyperdense vessel or unexpected calcification. Skull: Normal. Negative for fracture or focal lesion. Sinuses/Orbits: No acute finding. IMPRESSION: 1. No acute finding. 2. Chronic small vessel ischemia in the cerebral white matter. Electronically Signed   By: Tiburcio Pea M.D.   On: 12/06/2022 10:36    Pending Labs Unresulted Labs (From admission, onward)    None       Vitals/Pain Today's Vitals   12/07/22 0800 12/07/22 0842 12/07/22 1000 12/07/22 1143  BP: 109/73  130/79   Pulse: 67  65 72  Resp:  15  16 18   Temp:  98.3 F (36.8 C)    TempSrc:      SpO2: 97%  97% 98%  PainSc:        Isolation Precautions No active isolations  Medications Medications  enoxaparin (LOVENOX) injection 40 mg (40 mg Subcutaneous Given 12/06/22 2010)  sodium chloride flush (NS) 0.9 % injection 3 mL (3 mLs Intravenous Given 12/07/22 1041)  acetaminophen (TYLENOL) tablet 650 mg (has no administration in time range)    Or  acetaminophen (TYLENOL) suppository 650 mg (has no administration in time range)  aspirin  chewable tablet 81 mg (81 mg Oral Given 12/07/22 1035)  clopidogrel (PLAVIX) tablet 75 mg (75 mg Oral Given 12/07/22 1035)  venlafaxine XR (EFFEXOR-XR) 24 hr capsule 150 mg (150 mg Oral Given 12/06/22 2133)  atorvastatin (LIPITOR) tablet 80 mg (80 mg Oral Given 12/07/22 1035)  food thickener (SIMPLYTHICK (HONEY/LEVEL 3/MODERATELY THICK)) 1 packet (has no administration in time range)  LORazepam (ATIVAN) injection 0.5 mg (0.5 mg Intravenous Given 12/06/22 1338)   stroke: early stages of recovery book ( Does not apply Given 12/07/22 1034)  iohexol (OMNIPAQUE) 350 MG/ML injection 75 mL (75 mLs Intravenous Contrast Given 12/06/22 1659)    Mobility walks     Focused Assessments Cardiac Assessment Handoff:    NSR  , Neuro Assessment Handoff:  Swallow screen pass? Yes    NIH Stroke Scale  Dizziness Present: No Headache Present: No Interval: Shift assessment Level of Consciousness (1a.)   : Alert, keenly responsive LOC Questions (1b. )   : Answers both questions correctly LOC Commands (1c. )   : Performs both tasks correctly Best Gaze (2. )  : Normal Visual (3. )  : No visual loss Facial Palsy (4. )    : Normal symmetrical movements Motor Arm, Left (5a. )   : No drift Motor Arm, Right (5b. ) : No drift Motor Leg, Left (6a. )  : No drift Motor Leg, Right (6b. ) : No drift Limb Ataxia (7. ): Absent Sensory (8. )  : Normal, no sensory loss Best Language (9. )  : No aphasia Dysarthria (10. ): Mild-to-moderate dysarthria, patient slurs at least some words and, at worst, can be understood with some difficulty Extinction/Inattention (11.)   : No Abnormality Complete NIHSS TOTAL: 1     Neuro Assessment: Exceptions to WDL Neuro Checks:   Initial (12/06/22 1024)  Has TPA been given? No    R Recommendations: See Admitting Provider Note  Report given to:   Additional Notes: Pt A&Ox4, ambulatory w/ steady gait. Per speech, meds need crushed in applesauce from what the provider told me,  but speech told me meds in applesauce and pt tolerated them fine whole in applesauce. Still waiting for speech's recommendations to be placed in the chart for provider to place diet order and how thick they want liquids to be. Pt allowed to have sips of regular water at bedside for now per speech and MD. Only has slurred speech. NIHSS q4h, next due at 1400. Pt is calm and pleasant.

## 2022-12-07 NOTE — Progress Notes (Signed)
Summary: Patient is a 72 y.o. with a history of depression who was admitted to the ED for speech changes. Her friend Idamae Lusher is in the room.   Subjective:  Patient feeling better this morning. Denies any CP, headache, changes in vision, N/V, or confusion. Ate some ice cream and tolerated something to drink last night. Has had limited mobility since arriving to the hospital, felt out of balance last time she got up from the bed. 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 Told PT/OT/SLP would be able to help address those concerns after their evaluations.   Objective:  Vital signs in last 24 hours: Vitals:   12/07/22 0400 12/07/22 0600 12/07/22 0730 12/07/22 0842  BP: 111/71 96/61    Pulse: 65 66 61   Resp: 16 18 18    Temp:    98.3 F (36.8 C)  TempSrc:      SpO2: 96% 97% 98%       Latest Ref Rng & Units 12/07/2022    3:45 AM 12/06/2022   10:29 AM  CMP  Glucose 70 - 99 mg/dL 027  96   BUN 8 - 23 mg/dL 13  14   Creatinine 2.53 - 1.00 mg/dL 6.64  4.03   Sodium 474 - 145 mmol/L 135  138   Potassium 3.5 - 5.1 mmol/L 3.5  3.8   Chloride 98 - 111 mmol/L 103  102   CO2 22 - 32 mmol/L 23  25   Calcium 8.9 - 10.3 mg/dL 9.0  9.2   Total Protein 6.5 - 8.1 g/dL 6.9  7.1   Total Bilirubin 0.3 - 1.2 mg/dL 0.9  0.7   Alkaline Phos 38 - 126 U/L 78  74   AST 15 - 41 U/L 23  27   ALT 0 - 44 U/L 23  24        Latest Ref Rng & Units 12/07/2022    3:45 AM 12/06/2022   10:29 AM  CBC  WBC 4.0 - 10.5 K/uL 7.7  5.6   Hemoglobin 12.0 - 15.0 g/dL 25.9  56.3   Hematocrit 36.0 - 46.0 % 41.6  41.2   Platelets 150 - 400 K/uL 230  217     Lipid panel Component Ref Range & Units 03:45  Cholesterol 0 - 200 mg/dL 875 High   Triglycerides <150 mg/dL 643  HDL >32 mg/dL 49  Total CHOL/HDL Ratio RATIO 4.4  VLDL 0 - 40 mg/dL 26  LDL Cholesterol 0 - 99 mg/dL 951 High    TTE pending    Physical Exam Constitutional: Patient is resting comfortably in bed in no acute distress.  CV: Regular rate and rhythm without murmurs on auscultation. No LE edema.  Pulmonary/Respiratory: Clear to auscultation bilaterally, normal respiratory effort.  Abdominal: Soft, non-tender, non-distended; positive bowel sounds  Neuro: Weakness on finger spreading R>L and pushing against resistance RLE > LLE; slower finger touch coordination on R vs L, especially with an increase in speed; speech slow, dysarthric; asking and answering questions appropriately, able to name specific vocabulary (like key and pen) when prompted.  Skin: Warm and dry Psych: Normal mood and affect, slightly teary when asking about recovery of speech and R side function   Assessment/Plan:  Principal Problem:   CVA (cerebral vascular accident) (HCC) Acute ischemic stroke in left corona radiata  Dysarthria   Patient is a 72 y.o. with a  history of depression who was admitted to the ED for speech changes. Only prescribed medication was venlafaxine 150 mg at bedtime. LKN Thursday 7/18, patient out of window for TNKase. Neurology consulted and following. Patient will be admitted for stroke/TIA workup.    On admission, patient presented with R sided focal weakness in right hand and right lower extremity (slightly weaker with oppositional force compared to left), an episode of dysphagia, and dysarthric speech. Findings aligned with MRI findings of acute infract in the left corona radiata and extensive chronic small vessel ischemic disease.    Patient's R sided weakness in hand strength, LE strength, and slowed coordination stable today. Speech continues to be dysarthric. SLP recommending acute inpatient rehab, will perform a modified swallow study today given some trouble with bedside swallow study. PT and OT evaluations pending. BP improved, avoid hypotension.   Plan:  - Permissive HTN for 48 hours goal BP  - Avoid PO  antihypertensives until permissive HTN period over - Follow up TTE w/ bubble - Continue Aspirin 81 mg daily - Continue Plavix 75 mg daily antiplt/anticoag - Started on atorvastatin 80 for stroke prevention, Cr 0.81 - Follow-up swallow study/SLP recommendations - Continue acetaminophen 650 mg q6H PRN for mild pain or fever - q4 hr neuro checks - Telemetry  - PT/OT evals - STAT head CT for any change in neuro exam - Stroke education - Amb referral to neurology upon discharge with [facility]  - Daily labs, CBC, CMP  Pre-diabetes monitoring - A1C 5.6, monitor and recommend follow up outpatient.    Hypertension - Patient not formally diagnosed with hypertension, blood pressures intermittently low late this morning but now 130/79. Currently in a period of permissive HTN for 48 hours with goal BP <220/110. Continue to monitor during hospitalization to determine BP needs.    Depression - Chronic, will resume home dose of Venlafaxine 150 mg    DVT prophylaxis: Lovenox 40 mg subcutaneous Diet: Advance as tolerated, can specify once reccs are given by SLP  Code: Full    Prior to Admission Living Arrangement: home Anticipated Discharge Location: [facility] Barriers to Discharge: medical management  Dispo: Anticipated discharge in approximately 1-2 day(s).   Philomena Doheny, MD, PGY-1 12/07/2022, 9:07 AM Pager: [phone] After 5pm on weekdays and 1pm on weekends: On Call pager [phone]

## 2022-12-07 NOTE — Care Management Obs Status (Signed)
MEDICARE OBSERVATION STATUS NOTIFICATION   Patient Details  Name: Martha Spencer MRN: [id number] Date of Birth: June 02, 1950   Medicare Observation Status Notification Given:  Yes    Lawerance Sabal, RN 12/07/2022, 4:09 PM

## 2022-12-07 NOTE — Evaluation (Addendum)
Speech Language Pathology Evaluation Patient Details Name: Martha Spencer MRN: [id number] DOB: 07-18-1950 Today's Date: 12/07/2022 Time: 1005-1030 SLP Time Calculation (min) (ACUTE ONLY): 25 min  Problem List:  Patient Active Problem List   Diagnosis Date Noted   CVA (cerebral vascular accident) (HCC) 12/06/2022   Chronic constipation 04/11/2013   Abdominal wall mass of right lower quadrant 04/05/2013   Past Medical History:  Past Medical History:  Diagnosis Date   Anxiety    Depression    GERD (gastroesophageal reflux disease)    occ   Past Surgical History:  Past Surgical History:  Procedure Laterality Date   ADENOIDECTOMY     COLONOSCOPY  2009   Dr Virginia Rochester -poor prep    EYE SURGERY     OOPHORECTOMY     WISDOM TOOTH EXTRACTION     HPI:  Patient is a 72 y.o. with a history of depression who was admitted to the ED for speech changes. Patient lives alone and reports that she noticed speech/swallowing changes yesterday. Per patient, had one episode of choking on coffee the morning of 7/18. While watching TV, noticed she had difficulty repeating things accurately, voice sounded different. Woke on 7/21, drank a few sips of coffee with no further choking, but became concerned when voice seemed slower and different than usual and called EMS. Endorses slight headache now but thinks it may be due to caffeine withdrawal since she limited her usual coffee intake to a few sips today. Patient denies falls, numbness, weakness, visual changes, or difficulties ambulating. Pt reports her voice has a lower speaking voice and moderate fatigue following more than a few minutes of speech. Pt reports no difficulty in thinking skills or word finding. The pt reports eating ice cream last night with no issue and has had a few sips of water since admission with no concerns.   Assessment / Plan / Recommendation Clinical Impression  Pt seen for skilled ST evaluation to evaluate cognition and speech/language skills.  The pt was assessed with the SLUMS and a portion of the western bedside aphasia. The pt scored a 25/30 on the SLUMS. The pt only lost points on delayed recall, which the pt reports is difficult at baseline. The pt's areas of strength were: attention, orientation, visual spatial, registration, and numeric calculation. The pt reports that her memory and thinking skills are [facility] and have not experienced any changes. The pt had reduced intelligibility t/o the session. The pt's intelligibility with multisyllabic words was approx 85%, the errors were consistent so no concern for apraxia. The pts intelligibility at sentence level and conversational speech was approx. 75% success, with decreased intelligibility and vocal intensity t/o the extent of the visit. Pt able to functionally verbally communicate her wants and needs with staff. OME completed, the pt had mild muscle weakness in her tongue but no asymmetry. The pt reports feeling "very tired" after talking more than a few minutes, which aligns with observed decreased intelligibility and vocal intensity. Pt was given brief education on the results the assessment and the POC for current speech concerns. Pt presents with no acute cognitive concerns and with unspecified mild-moderate dysarthria. Pt appropriate for continued ST to address dysarthric speech.    SLP Assessment  SLP Recommendation/Assessment: Patient needs continued Speech Lanaguage Pathology Services SLP Visit Diagnosis: Dysarthria and anarthria (R47.1);Dysphagia, unspecified (R13.10)    Recommendations for follow up therapy are one component of a multi-disciplinary discharge planning process, led by the attending physician.  Recommendations may be updated based on patient  status, additional functional criteria and insurance authorization.    Follow Up Recommendations  Acute inpatient rehab (3hours/day)    Assistance Recommended at Discharge     Functional Status Assessment Patient has had a  recent decline in their functional status and demonstrates the ability to make significant improvements in function in a reasonable and predictable amount of time.  Frequency and Duration min 3x week  1 week      SLP Evaluation Cognition  Overall Cognitive Status: Within Functional Limits for tasks assessed Arousal/Alertness: Awake/alert Orientation Level: Oriented X4 Year: 2024 Day of Week: Correct Attention: Focused;Sustained Focused Attention: Appears intact Sustained Attention: Appears intact Memory: Appears intact Awareness: Appears intact Problem Solving: Appears intact Executive Function: Reasoning Reasoning: Appears intact Safety/Judgment: Appears intact       Comprehension  Auditory Comprehension Overall Auditory Comprehension: Appears within functional limits for tasks assessed Yes/No Questions: Within Functional Limits Commands: Within Functional Limits Conversation: Complex Visual Recognition/Discrimination Discrimination: Within Function Limits Reading Comprehension Reading Status: Within funtional limits (pt observed reading stroke informational packet and messages on her phone with no difficulty)    Expression Expression Primary Mode of Expression: Verbal Verbal Expression Overall Verbal Expression: Appears within functional limits for tasks assessed Initiation: No impairment Level of Generative/Spontaneous Verbalization: Conversation Repetition: No impairment Naming: No impairment Pragmatics: No impairment Written Expression Dominant Hand: Right Written Expression: Not tested   Oral / Motor  Oral Motor/Sensory Function Overall Oral Motor/Sensory Function: Mild impairment Facial ROM: Reduced right;Reduced left Facial Symmetry: Within Functional Limits Facial Strength: Reduced right;Reduced left Facial Sensation: Within Functional Limits Lingual ROM: Within Functional Limits Lingual Symmetry: Within Functional Limits Lingual Strength:  Reduced Lingual Sensation: Within Functional Limits Mandible: Within Functional Limits Motor Speech Overall Motor Speech: Impaired Respiration: Within functional limits Phonation: Low vocal intensity Resonance: Within functional limits Articulation: Impaired Level of Impairment: Word Intelligibility: Intelligibility reduced Word: 75-100% accurate Phrase: 75-100% accurate Sentence: 50-74% accurate Conversation: 50-74% accurate Motor Planning: Witnin functional limits Motor Speech Errors: Aware;Consistent Effective Techniques: Over-articulate;Slow rate    Dione Housekeeper M.S. CF-SLP

## 2022-12-07 NOTE — Evaluation (Signed)
Occupational Therapy Evaluation Patient Details Name: Martha Spencer MRN: [id number] DOB: 21-Oct-1950 Today's Date: 12/07/2022   History of Present Illness Pt is a 72 y.o. female admitted 7/20 with c/o slurred speech. 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 PMH: GERD, anxiety, depression   Clinical Impression   PTA, pt lived alone and was independent. Upon eval, pt requires up to supervision A for longer distance mobility due to mild balance difficulty with directional changes. Pt additionally with min decreased RUE FM coordination and strength. Pt with decr speed of finger opposition and in-hand manipulation. Pt also R hand dominant. 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 Pt interested and could benefit from OP OT to optimize safety and independence in ADL and IADL.     Recommendations for follow up therapy are one component of a multi-disciplinary discharge planning process, led by the attending physician.  Recommendations may be updated based on patient status, additional functional criteria and insurance authorization.   Assistance Recommended at Discharge Intermittent Supervision/Assistance  Patient can return home with the following Other (comment) (on request)    Functional Status Assessment  Patient has had a recent decline in their functional status and demonstrates the ability to make significant improvements in function in a reasonable and predictable amount of time.  Equipment Recommendations  None recommended by OT    Recommendations for Other Services Speech consult     Precautions / Restrictions Precautions Precautions: None Restrictions Weight Bearing Restrictions: No      Mobility Bed Mobility Overal bed mobility: Independent                  Transfers Overall transfer level: Independent Equipment used: None                      Balance Overall balance assessment:  Mild deficits observed, not formally tested                                         ADL either performed or assessed with clinical judgement   ADL Overall ADL's : Modified independent                                       General ADL Comments: increased time for FM tasks and mild balance difficulty noted with quick movements     Vision Baseline Vision/History: 1 Wears glasses Ability to See in Adequate Light: 0 Adequate Patient Visual Report: No change from baseline Vision Assessment?: No apparent visual deficits Additional Comments: Mild additional eye movement with tracking but pt reports vertigo at baseline. visual fields appear WFL. Ptable to read signage on walls     Perception Perception Perception Tested?: No   Praxis Praxis Praxis tested?: Not tested    Pertinent Vitals/Pain Pain Assessment Pain Assessment: No/denies pain     Hand Dominance Right   Extremity/Trunk Assessment Upper Extremity Assessment Upper Extremity Assessment: RUE deficits/detail RUE Deficits / Details: mild deficits in FM coordination and min decreased strength as compared to baseline. RUE Coordination: decreased fine motor   Lower Extremity Assessment Lower Extremity Assessment: Defer to PT evaluation   Cervical / Trunk Assessment Cervical / Trunk Assessment: Normal   Communication Communication Communication: No difficulties   Cognition Arousal/Alertness:  Awake/alert Behavior During Therapy: WFL for tasks assessed/performed Overall Cognitive Status: Within Functional Limits for tasks assessed                                 General Comments: Passing Short Blessed Test with score of 0. able to perform basic financial management as well as medication management.     General Comments  will continue to follow    Exercises     Shoulder Instructions      Home Living Family/patient expects to be discharged to:: Private  residence Living Arrangements: Alone Available Help at Discharge: Friend(s);Available PRN/intermittently Type of Home: Apartment (3rd floor) Home Access: Elevator;Stairs to enter Entrance Stairs-Number of Steps: 2 flights   Home Layout: One level     Bathroom Shower/Tub: Chief Strategy Officer: Standard     Home Equipment: None      Lives With: Alone    Prior Functioning/Environment Prior Level of Function : Independent/Modified Independent;Driving               ADLs Comments: works as a Programmer, applications Problem List: Decreased strength;Impaired balance (sitting and/or standing);Decreased cognition      OT Treatment/Interventions: Self-care/ADL training;Therapeutic exercise;DME and/or AE instruction;Patient/family education;Balance training;Therapeutic activities    OT Goals(Current goals can be found in the care plan section) Acute Rehab OT Goals Patient Stated Goal: get back to work OT Goal Formulation: With patient Time For Goal Achievement: 12/21/22 Potential to Achieve Goals: Good  OT Frequency: Min 1X/week    Co-evaluation              AM-PAC OT "6 Clicks" Daily Activity     Outcome Measure Help from another person eating meals?: None Help from another person taking care of personal grooming?: None Help from another person toileting, which includes using toliet, bedpan, or urinal?: None Help from another person bathing (including washing, rinsing, drying)?: None Help from another person to put on and taking off regular upper body clothing?: None Help from another person to put on and taking off regular lower body clothing?: None 6 Click Score: 24   End of Session Equipment Utilized During Treatment: Gait belt Nurse Communication: Mobility status  Activity Tolerance: Patient tolerated treatment well Patient left: in bed;with call bell/phone within reach;with family/visitor present  OT Visit Diagnosis: Unsteadiness on feet  (R26.81);Muscle weakness (generalized) (M62.81)                Time: 5956-3875 OT Time Calculation (min): 39 min Charges:  OT General Charges $OT Visit: 1 Visit OT Evaluation $OT Eval Low Complexity: 1 Low OT Treatments $Self Care/Home Management : 23-37 mins  Tyler Deis, OTR/L [facility] Acute Rehabilitation Office: [phone]   Myrla Halsted 12/07/2022, 2:19 PM

## 2022-12-07 NOTE — Progress Notes (Addendum)
STROKE TEAM PROGRESS NOTE   BRIEF HPI [name] is a 72 y.o. female with history of anxiety and depression who presented 7/20 with dysarthria. NIH: 1. Exam revealed some decreased fine motor movements of the right hand also.  LKW: 7/18, uncertain time TNK given?: no, outside of window IR Thrombectomy? No, no LVO Modified Rankin Scale: 0  SIGNIFICANT HOSPITAL EVENTS 7/20: Admitted for stroke work-up. Aspirin and Plavix started.   INTERIM HISTORY/SUBJECTIVE On exam patient is dysarthric, with reduced fine motor movements of the right hand.   OBJECTIVE  CBC    Component Value Date/Time   WBC 7.7 12/07/2022 0345   RBC 4.59 12/07/2022 0345   HGB 13.8 12/07/2022 0345   HCT 41.6 12/07/2022 0345   PLT 230 12/07/2022 0345   MCV 90.6 12/07/2022 0345   MCH 30.1 12/07/2022 0345   MCHC 33.2 12/07/2022 0345   RDW 12.5 12/07/2022 0345   LYMPHSABS 1.5 12/06/2022 1029   MONOABS 0.6 12/06/2022 1029   EOSABS 0.2 12/06/2022 1029   BASOSABS 0.1 12/06/2022 1029    BMET    Component Value Date/Time   NA 135 12/07/2022 0345   K 3.5 12/07/2022 0345   CL 103 12/07/2022 0345   CO2 23 12/07/2022 0345   GLUCOSE 114 (H) 12/07/2022 0345   BUN 13 12/07/2022 0345   CREATININE 0.81 12/07/2022 0345   CALCIUM 9.0 12/07/2022 0345   GFRNONAA >60 12/07/2022 0345    IMAGING past 24 hours CT ANGIO HEAD NECK W WO CM  Result Date: 12/06/2022 CLINICAL DATA:  Stroke/TIA, determine embolic source EXAM: CT ANGIOGRAPHY HEAD AND NECK WITH AND WITHOUT CONTRAST TECHNIQUE: Multidetector CT imaging of the head and neck was performed using the standard protocol during bolus administration of intravenous contrast. Multiplanar CT image reconstructions and MIPs were obtained to evaluate the vascular anatomy. Carotid stenosis measurements (when applicable) are obtained utilizing NASCET criteria, using the distal internal carotid diameter as the denominator. RADIATION DOSE REDUCTION: This exam was performed according  to the departmental dose-optimization program which includes automated exposure control, adjustment of the mA and/or kV according to patient size and/or use of iterative reconstruction technique. CONTRAST:  75mL OMNIPAQUE IOHEXOL 350 MG/ML SOLN COMPARISON:  CT Head 12/06/22, MR head 12/06/22 FINDINGS: CT HEAD FINDINGS See same day CT head for intracranial findings. No contrast-enhancing lesion is visualized. CTA NECK FINDINGS Aortic arch: Standard branching. Imaged portion shows no evidence of aneurysm or dissection. No significant stenosis of the major arch vessel origins. Right carotid system: No evidence of dissection, stenosis (50% or greater), or occlusion. Left carotid system: No evidence of dissection, stenosis (50% or greater), or occlusion. Vertebral arteries: Codominant. No evidence of dissection, stenosis (50% or greater), or occlusion. Skeleton: Negative. Other neck: Negative. Upper chest: Negative. Review of the MIP images confirms the above findings CTA HEAD FINDINGS Anterior circulation: No significant stenosis, proximal occlusion, aneurysm, or vascular malformation. Posterior circulation: No significant stenosis, proximal occlusion, aneurysm, or vascular malformation. Venous sinuses: As permitted by contrast timing, patent. Anatomic variants: None Review of the MIP images confirms the above findings IMPRESSION: 1. No intracranial large vessel occlusion or significant stenosis. 2. No hemodynamically significant stenosis in the neck. 3. See same day CT head and brain MRI for intracranial findings. Electronically Signed   By: Lorenza Cambridge M.D.   On: 12/06/2022 17:30   MR BRAIN WO CONTRAST  Result Date: 12/06/2022 CLINICAL DATA:  Neuro deficit, acute, stroke suspected. Slurred speech. EXAM: MRI HEAD WITHOUT CONTRAST TECHNIQUE: Multiplanar, multiecho  pulse sequences of the brain and surrounding structures were obtained without intravenous contrast. COMPARISON:  Head CT 12/06/2022 FINDINGS: Brain: There  is a 2 cm acute infarct in the left corona radiata. No intracranial hemorrhage, mass, midline shift, or extra-axial fluid collection is identified. Patchy and confluent T2 hyperintensities in the cerebral white matter bilaterally and in the pons are nonspecific but compatible with extensive chronic small vessel ischemic disease. A chronic lacunar infarct is noted in the right corona radiata. The ventricles and sulci are within normal for age. Vascular: Major intracranial vascular flow voids are preserved. Skull and upper cervical spine: Unremarkable bone marrow signal. Sinuses/Orbits: Unremarkable orbits. Paranasal sinuses and mastoid air cells are clear. Other: None. IMPRESSION: 1. Acute infarct in the left corona radiata. 2. Extensive chronic small vessel ischemic disease. Electronically Signed   By: Sebastian Ache M.D.   On: 12/06/2022 14:28   CT HEAD WO CONTRAST  Result Date: 12/06/2022 CLINICAL DATA:  Aphasia. EXAM: CT HEAD WITHOUT CONTRAST TECHNIQUE: Contiguous axial images were obtained from the base of the skull through the vertex without intravenous contrast. RADIATION DOSE REDUCTION: This exam was performed according to the departmental dose-optimization program which includes automated exposure control, adjustment of the mA and/or kV according to patient size and/or use of iterative reconstruction technique. COMPARISON:  None Available. FINDINGS: Brain: No evidence of acute infarction, hemorrhage, hydrocephalus, extra-axial collection or mass lesion/mass effect. Extensive low-density in the cerebral white matter attributed to chronic small vessel ischemia. Vascular: No hyperdense vessel or unexpected calcification. Skull: Normal. Negative for fracture or focal lesion. Sinuses/Orbits: No acute finding. IMPRESSION: 1. No acute finding. 2. Chronic small vessel ischemia in the cerebral white matter. Electronically Signed   By: Tiburcio Pea M.D.   On: 12/06/2022 10:36    Vitals:   12/07/22 0300  12/07/22 0400 12/07/22 0600 12/07/22 0730  BP: 100/65 111/71 96/61   Pulse: 68 65 66 61  Resp: 17 16 18 18   Temp:      TempSrc:      SpO2: 97% 96% 97% 98%     PHYSICAL EXAM General:  Alert, well-nourished, well-developed patient in no acute distress Psych:  Mood and affect appropriate for situation CV: Regular rate and rhythm on monitor Respiratory:  Regular, unlabored respirations on room air GI: Abdomen soft and nontender   NEURO:  Mental Status: AA&Ox3, patient is able to give clear and coherent history Speech/Language: speech is dysarthric. No aphasia.  Naming, repetition, fluency, and comprehension intact.  Cranial Nerves:  II: PERRL. Visual fields full.  III, IV, VI: EOMI. Eyelids elevate symmetrically.  V: Sensation is intact to light touch and symmetrical to face.  VII: Face is symmetrical resting and smiling VIII: hearing intact to voice. IX, X: Palate elevates symmetrically. Phonation is normal.  IR:JJOACZYS shrug 5/5. XII: tongue is midline without fasciculations. Motor: 5/5 strength to all muscle groups tested. 4+/5 right grip.  Tone: is normal and bulk is normal Sensation- Intact to light touch bilaterally. Extinction absent to light touch to DSS.   Coordination: FTN intact bilaterally, HKS: no ataxia in BLE.No drift. Decreased fine motor movements in right hand Gait- deferred   ASSESSMENT/PLAN  Acute Ischemic Infarct:  left corona radiata Etiology:  pending full work-up, likely small vessel disease  Code Stroke CT head: No acute abnormality. Small vessel disease.    CTA head & neck: No LVO or sig stenosis  MRI: Acute infarct in the left corona radiata  2D Echo: EF 70% LDL 139 HgbA1c 5.6  VTE prophylaxis - lovenox No antithrombotic prior to admission, now on aspirin 81 mg daily and clopidogrel 75 mg daily for 3 weeks and then aspirin alone. Therapy recommendations:  pending Disposition:  pending  Hypertension, no history Home meds:  none Blood  Pressure Goal: BP less than 220/110, permissive hypertension. Gradually back to normotensive today. Avoid hypotension.   Hyperlipidemia Home meds:  none LDL 139, goal < 70 Add Crestor 20mg   Continue statin at discharge  Diabetes type II, Pre-Diabetic Home meds:  none HgbA1c 5.6, goal < 7.0 CBGs SSI Recommend close follow-up   Dysphagia Patient has post-stroke dysphagia, SLP consulted    Diet   Diet NPO time specified   Advance diet as tolerated Barium Swallow test ordered by SLP   Other Active Problems Anxiety/Depression Home Effexor continued  Hospital day # 0  Follow-up with [facility] 8 weeks after discharge.  Ambulatory referral ordered.   Pt seen by Neuro NP/APP and later by MD. Note/plan to be edited by MD as needed.    Lynnae January, DNP, AGACNP-BC [facility] Please use AMION for contact information & EPIC for messaging.  ATTENDING ATTESTATION:  Left corona radiata stroke.  DAPT therapy for 3 weeks then aspirin alone.  Echo still pending if negative no further workup needed.  Can follow-up with outpatient neurology Dr. Pearlean Brownie in stroke clinic in 6 to 8 weeks.  ADDENDUM: Echo unremarkable.   Neurology will sign off.  Dr. Viviann Spare evaluated pt independently, reviewed imaging, chart, labs. Discussed and formulated plan with the Resident/APP. Changes were made to the note where appropriate. Please see APP/resident note above for details.    Jazmin Vensel,MD   To contact Stroke Continuity provider, please refer to [email or web address]. After hours, contact General Neurology

## 2022-12-07 NOTE — ED Notes (Signed)
Called 3W to notify of pt being transported to inpatient bed

## 2022-12-07 NOTE — Evaluation (Signed)
Clinical/Bedside Swallow Evaluation Patient Details  Name: Martha Spencer MRN: [id number] Date of Birth: 12-15-1950  Today's Date: 12/07/2022 Time: SLP Start Time (ACUTE ONLY): 1030 SLP Stop Time (ACUTE ONLY): 1056 SLP Time Calculation (min) (ACUTE ONLY): 26 min  Past Medical History:  Past Medical History:  Diagnosis Date   Anxiety    Depression    GERD (gastroesophageal reflux disease)    occ   Past Surgical History:  Past Surgical History:  Procedure Laterality Date   ADENOIDECTOMY     COLONOSCOPY  2009   Dr Virginia Rochester -poor prep    EYE SURGERY     OOPHORECTOMY     WISDOM TOOTH EXTRACTION     HPI:  Patient is a 72 y.o. with a history of depression who was admitted to the ED for speech changes. Patient lives alone and reports that she noticed speech/swallowing changes yesterday. Per patient, had one episode of choking on coffee the morning of 7/18. While watching TV, noticed she had difficulty repeating things accurately, voice sounded different. Woke on 7/21, drank a few sips of coffee with no further choking, but became concerned when voice seemed slower and different than usual and called EMS. Endorses slight headache now but thinks it may be due to caffeine withdrawal since she limited her usual coffee intake to a few sips today. Patient denies falls, numbness, weakness, visual changes, or difficulties ambulating. Pt reports her voice has a lower speaking voice and moderate fatigue following more than a few minutes of speech. Pt reports no difficulty in thinking skills or word finding. The pt reports eating ice cream last night with no issue and has had a few sips of water since admission with no concerns.    Assessment / Plan / Recommendation  Clinical Impression  Pt seen for skilled ST evaluation of swallow function. Pt is currently NPO and was assessed with thin liquid, puree and solids. The pts OME revealed mild labial and lingual weakness, no asymmetry observed. The pt consumed thin  liquids via straw x1, via cup sip x5, and via cup sip with medication x2. The pt reports increased difficulty with PO intake with straw, but no s/sx of aspiration. The pt had no overt s/sx with x5 small cup sips of thin in isolation. The pt was unable to clear medication with x2 cup sips, ("I can feel it stuck in there"). The pt consumed puree x4 to finish taking her medication with no overt s/sx of aspiration. The pt consumed x1 regular solid with an immediate throat clearance and notable discomfort ("I can feel the crumbs stuck in my throat"), pt asked to use a liquid wash to clear remaining bolus. The pt had an immediate and prolonged cough following x1 cup sip of thin liquid. The pt reports feeling tired from speaking and PO trials, and the pt had decreased intelligibility and reduced vocal intensity by the end of PO trials and speech and language eval. Thickened liquids not administered at this time due to patient fatigue and lack of access in the ED. Given the pts results of the BSE, safest diet recs are IDDSI 4 Puree solids/IDDSI 2 mildly thickened liquids with STRICT aspiration precautions (small bites and sips, reduced rate of PO intake, sit upright for all PO intake and 30 minutes following). Pt is allowed to consume SMALL sips of water with no straw outside of meals. Modified barium swallow study ordered at this time. Pt given education about results from the BSE and diet recommendations, pt verbally agreeable  to SLP suggestion. MD and nursing informed of diet recs. SLP to follow up with MBS and skilled ST treatment for swallow goals. SLP Visit Diagnosis: Dysarthria and anarthria (R47.1);Dysphagia, unspecified (R13.10)    Aspiration Risk  Moderate aspiration risk    Diet Recommendation Dysphagia 1 (Puree);Nectar-thick liquid    Liquid Administration via: No straw;Cup Medication Administration: Crushed with puree Supervision: Patient able to self feed Compensations: Small sips/bites;Slow  rate Postural Changes: Seated upright at 90 degrees;Remain upright for at least 30 minutes after po intake    Other  Recommendations Oral Care Recommendations:  (Pt can have SMALL sips of thin liquid water between meals.)    Recommendations for follow up therapy are one component of a multi-disciplinary discharge planning process, led by the attending physician.  Recommendations may be updated based on patient status, additional functional criteria and insurance authorization.  Follow up Recommendations Acute inpatient rehab (3hours/day)      Assistance Recommended at Discharge    Functional Status Assessment Patient has had a recent decline in their functional status and demonstrates the ability to make significant improvements in function in a reasonable and predictable amount of time.  Frequency and Duration min 3x week  1 week       Prognosis Prognosis for improved oropharyngeal function: Good Barriers/Prognosis Comment: n/a      Swallow Study   General HPI: Patient is a 72 y.o. with a history of depression who was admitted to the ED for speech changes. Patient lives alone and reports that she noticed speech/swallowing changes yesterday. Per patient, had one episode of choking on coffee the morning of 7/18. While watching TV, noticed she had difficulty repeating things accurately, voice sounded different. Woke on 7/21, drank a few sips of coffee with no further choking, but became concerned when voice seemed slower and different than usual and called EMS. Endorses slight headache now but thinks it may be due to caffeine withdrawal since she limited her usual coffee intake to a few sips today. Patient denies falls, numbness, weakness, visual changes, or difficulties ambulating. Pt reports her voice has a lower speaking voice and moderate fatigue following more than a few minutes of speech. Pt reports no difficulty in thinking skills or word finding. The pt reports eating ice cream last  night with no issue and has had a few sips of water since admission with no concerns. Type of Study: Bedside Swallow Evaluation Previous Swallow Assessment: n/a Diet Prior to this Study: NPO (pt on a regular/thin liquid diet prior to hospitalization) Temperature Spikes Noted: No Respiratory Status: Room air History of Recent Intubation: No Behavior/Cognition: Alert;Pleasant mood Oral Cavity Assessment: Within Functional Limits Oral Care Completed by SLP: No Oral Cavity - Dentition: Adequate natural dentition Vision: Functional for self-feeding Self-Feeding Abilities: Able to feed self Patient Positioning: Upright in bed Baseline Vocal Quality: Low vocal intensity Volitional Swallow: Able to elicit (required extra time)    Oral/Motor/Sensory Function Overall Oral Motor/Sensory Function: Mild impairment Facial ROM: Reduced right;Reduced left Facial Symmetry: Within Functional Limits Facial Strength: Reduced right;Reduced left Facial Sensation: Within Functional Limits Lingual ROM: Within Functional Limits Lingual Symmetry: Within Functional Limits Lingual Strength: Reduced Lingual Sensation: Within Functional Limits Mandible: Within Functional Limits   Ice Chips     Thin Liquid Thin Liquid: Impaired Presentation: Cup;Self Fed;Straw Oral Phase Impairments: Reduced labial seal (reduced labial seal with straw) Pharyngeal  Phase Impairments: Multiple swallows;Cough - Immediate    Nectar Thick     Honey Thick  Puree Puree: Within functional limits Presentation: Self Fed;Spoon   Solid     Solid: Impaired Presentation: Self Fed Pharyngeal Phase Impairments: Multiple swallows;Throat Clearing - Immediate Other Comments: Pt reports increased difficulty with swallowing and that it felt stuck in her throat. pt had an immediate cough for cued liquid wash.      Dione Housekeeper M.S. CF-SLP

## 2022-12-08 ENCOUNTER — Other Ambulatory Visit (HOSPITAL_COMMUNITY): Payer: Self-pay

## 2022-12-08 ENCOUNTER — Observation Stay (HOSPITAL_COMMUNITY): Payer: Medicare Other

## 2022-12-08 DIAGNOSIS — F419 Anxiety disorder, unspecified: Secondary | ICD-10-CM | POA: Diagnosis present

## 2022-12-08 DIAGNOSIS — I6349 Cerebral infarction due to embolism of other cerebral artery: Secondary | ICD-10-CM | POA: Diagnosis present

## 2022-12-08 DIAGNOSIS — Z8249 Family history of ischemic heart disease and other diseases of the circulatory system: Secondary | ICD-10-CM | POA: Diagnosis not present

## 2022-12-08 DIAGNOSIS — K219 Gastro-esophageal reflux disease without esophagitis: Secondary | ICD-10-CM | POA: Diagnosis present

## 2022-12-08 DIAGNOSIS — I10 Essential (primary) hypertension: Secondary | ICD-10-CM | POA: Diagnosis present

## 2022-12-08 DIAGNOSIS — G8191 Hemiplegia, unspecified affecting right dominant side: Secondary | ICD-10-CM | POA: Diagnosis present

## 2022-12-08 DIAGNOSIS — E119 Type 2 diabetes mellitus without complications: Secondary | ICD-10-CM | POA: Diagnosis present

## 2022-12-08 DIAGNOSIS — Z882 Allergy status to sulfonamides status: Secondary | ICD-10-CM | POA: Diagnosis not present

## 2022-12-08 DIAGNOSIS — Z7902 Long term (current) use of antithrombotics/antiplatelets: Secondary | ICD-10-CM | POA: Diagnosis not present

## 2022-12-08 DIAGNOSIS — F32A Depression, unspecified: Secondary | ICD-10-CM | POA: Diagnosis present

## 2022-12-08 DIAGNOSIS — I6381 Other cerebral infarction due to occlusion or stenosis of small artery: Secondary | ICD-10-CM | POA: Diagnosis not present

## 2022-12-08 DIAGNOSIS — Z79899 Other long term (current) drug therapy: Secondary | ICD-10-CM | POA: Diagnosis not present

## 2022-12-08 DIAGNOSIS — Z602 Problems related to living alone: Secondary | ICD-10-CM | POA: Diagnosis present

## 2022-12-08 DIAGNOSIS — R4701 Aphasia: Secondary | ICD-10-CM | POA: Diagnosis present

## 2022-12-08 DIAGNOSIS — Z7982 Long term (current) use of aspirin: Secondary | ICD-10-CM | POA: Diagnosis not present

## 2022-12-08 DIAGNOSIS — Z91018 Allergy to other foods: Secondary | ICD-10-CM | POA: Diagnosis not present

## 2022-12-08 DIAGNOSIS — R471 Dysarthria and anarthria: Secondary | ICD-10-CM | POA: Diagnosis present

## 2022-12-08 DIAGNOSIS — Z8673 Personal history of transient ischemic attack (TIA), and cerebral infarction without residual deficits: Secondary | ICD-10-CM | POA: Diagnosis not present

## 2022-12-08 DIAGNOSIS — I639 Cerebral infarction, unspecified: Secondary | ICD-10-CM | POA: Diagnosis present

## 2022-12-08 DIAGNOSIS — R1310 Dysphagia, unspecified: Secondary | ICD-10-CM | POA: Diagnosis present

## 2022-12-08 DIAGNOSIS — E785 Hyperlipidemia, unspecified: Secondary | ICD-10-CM | POA: Diagnosis present

## 2022-12-08 DIAGNOSIS — R29701 NIHSS score 1: Secondary | ICD-10-CM | POA: Diagnosis present

## 2022-12-08 LAB — COMPREHENSIVE METABOLIC PANEL
ALT: 21 U/L (ref 0–44)
AST: 21 U/L (ref 15–41)
Albumin: 3.9 g/dL (ref 3.5–5.0)
Alkaline Phosphatase: 80 U/L (ref 38–126)
Anion gap: 6 (ref 5–15)
BUN: 20 mg/dL (ref 8–23)
CO2: 26 mmol/L (ref 22–32)
Calcium: 8.8 mg/dL — ABNORMAL LOW (ref 8.9–10.3)
Chloride: 103 mmol/L (ref 98–111)
Creatinine, Ser: 0.88 mg/dL (ref 0.44–1.00)
GFR, Estimated: >60 mL/min (ref >60)
Glucose, Bld: 92 mg/dL (ref 70–99)
Potassium: 3.8 mmol/L (ref 3.5–5.1)
Sodium: 135 mmol/L (ref 135–145)
Total Bilirubin: 1.1 mg/dL (ref 0.3–1.2)
Total Protein: 6.8 g/dL (ref 6.5–8.1)

## 2022-12-08 LAB — CBC
HCT: 41 % (ref 36.0–46.0)
Hemoglobin: 13.6 g/dL (ref 12.0–15.0)
MCH: 30.4 pg (ref 26.0–34.0)
MCHC: 33.2 g/dL (ref 30.0–36.0)
MCV: 91.7 fL (ref 80.0–100.0)
Platelets: 227 10*3/uL (ref 150–400)
RBC: 4.47 MIL/uL (ref 3.87–5.11)
RDW: 12.4 % (ref 11.5–15.5)
WBC: 5.5 10*3/uL (ref 4.0–10.5)
nRBC: 0 % (ref 0.0–0.2)

## 2022-12-08 MED ORDER — FOOD THICKENER (SIMPLYTHICK): 1.0000 | ORAL | Status: DC | PRN

## 2022-12-08 MED ORDER — ATORVASTATIN CALCIUM 80 MG PO TABS
80.0000 mg | ORAL_TABLET | Freq: Every day | ORAL | 0 refills | Status: DC
  Filled 2022-12-08: qty 30, 30d supply, fill #0

## 2022-12-08 MED ORDER — ASPIRIN 81 MG PO CHEW
81.0000 mg | CHEWABLE_TABLET | Freq: Every day | ORAL | 0 refills | Status: DC
  Filled 2022-12-08: qty 90, 90d supply, fill #0

## 2022-12-08 MED ORDER — CLOPIDOGREL BISULFATE 75 MG PO TABS
75.0000 mg | ORAL_TABLET | Freq: Every day | ORAL | 0 refills | Status: DC
Start: 2022-12-09 — End: 2022-12-08
  Filled 2022-12-08: qty 18, 18d supply, fill #0

## 2022-12-08 MED ORDER — FOOD THICKENER (SIMPLYTHICK)
1.0000 | ORAL | 0 refills | Status: DC | PRN
  Filled 2022-12-08: qty 10, fill #0

## 2022-12-08 MED ORDER — CLOPIDOGREL BISULFATE 75 MG PO TABS
75.0000 mg | ORAL_TABLET | Freq: Every day | ORAL | 0 refills | Status: DC
  Filled 2022-12-08: qty 18, 18d supply, fill #0

## 2022-12-08 NOTE — Progress Notes (Signed)
Occupational Therapy Treatment Patient Details Name: Martha Spencer MRN: [id number] DOB: 01-23-1951 Today's Date: 12/08/2022   History of present illness Pt is a 72 y.o. female admitted 7/20 with c/o slurred speech. 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 PMH: GERD, anxiety, depression   OT comments  Patient able to demonstrate good balance and visual scanning in hallway with no assistive device or physical assistance. Patient instructed in RUE HEP with yellow therapy band and resistive foam exercises to increase functional strength and finger dexterity. Discharge plans continue to be appropriate. Acute OT to continue to follow.    Recommendations for follow up therapy are one component of a multi-disciplinary discharge planning process, led by the attending physician.  Recommendations may be updated based on patient status, additional functional criteria and insurance authorization.    Assistance Recommended at Discharge Intermittent Supervision/Assistance  Patient can return home with the following  Other (comment) (on request)   Equipment Recommendations  None recommended by OT    Recommendations for Other Services      Precautions / Restrictions Precautions Precautions: None Restrictions Weight Bearing Restrictions: No       Mobility Bed Mobility Overal bed mobility: Independent                  Transfers Overall transfer level: Independent                       Balance Overall balance assessment: Mild deficits observed, not formally tested                                         ADL either performed or assessed with clinical judgement   ADL Overall ADL's : Modified independent                                       General ADL Comments: able to donn gown without assistance    Extremity/Trunk Assessment              Vision       Perception     Praxis      Cognition Arousal/Alertness:  Awake/alert Behavior During Therapy: WFL for tasks assessed/performed Overall Cognitive Status: Within Functional Limits for tasks assessed                                 General Comments: softly spoken, able to follow commands and demonstrate good safety        Exercises Exercises: General Upper Extremity, Other exercises General Exercises - Upper Extremity Shoulder Flexion: Strengthening, Right, 10 reps, Seated, Theraband Theraband Level (Shoulder Flexion): Level 1 (Yellow) Shoulder ABduction: Strengthening, Right, 10 reps, Seated, Theraband Theraband Level (Shoulder Abduction): Level 1 (Yellow) Elbow Flexion: Strengthening, Right, 10 reps, Seated, Theraband Theraband Level (Elbow Flexion): Level 1 (Yellow) Elbow Extension: Strengthening, Right, 10 reps, Seated, Theraband Theraband Level (Elbow Extension): Level 1 (Yellow) Digit Composite Flexion: AROM, Right, 15 reps Other Exercises Other Exercises: RUE finger dexterity exercises with resistive foam exercises    Shoulder Instructions       General Comments performed mobility in hallway to address balance, vision, and memory    Pertinent Vitals/ Pain       Pain Assessment Pain  Assessment: Faces Faces Pain Scale: No hurt  Home Living                                          Prior Functioning/Environment              Frequency  Min 1X/week        Progress Toward Goals  OT Goals(current goals can now be found in the care plan section)  Progress towards OT goals: Progressing toward goals  Acute Rehab OT Goals Patient Stated Goal: go home OT Goal Formulation: With patient Time For Goal Achievement: 12/21/22 Potential to Achieve Goals: Good ADL Goals Pt/caregiver will Perform Home Exercise Program: Right Upper extremity;Independently;With written HEP provided  Plan Discharge plan remains appropriate    Co-evaluation                 AM-PAC OT "6 Clicks" Daily  Activity     Outcome Measure   Help from another person eating meals?: None Help from another person taking care of personal grooming?: None Help from another person toileting, which includes using toliet, bedpan, or urinal?: None Help from another person bathing (including washing, rinsing, drying)?: None Help from another person to put on and taking off regular upper body clothing?: None Help from another person to put on and taking off regular lower body clothing?: None 6 Click Score: 24    End of Session Equipment Utilized During Treatment: Gait belt  OT Visit Diagnosis: Unsteadiness on feet (R26.81);Muscle weakness (generalized) (M62.81)   Activity Tolerance Patient tolerated treatment well   Patient Left in bed;with call bell/phone within reach;Other (comment) (seated on EOB)   Nurse Communication Mobility status        Time: 1031-1105 OT Time Calculation (min): 34 min  Charges: OT General Charges $OT Visit: 1 Visit OT Treatments $Therapeutic Activity: 8-22 mins $Therapeutic Exercise: 8-22 mins  Alfonse Flavors, OTA Acute Rehabilitation Services  Office [phone]   Dewain Penning 12/08/2022, 12:10 PM

## 2022-12-08 NOTE — Plan of Care (Signed)

## 2022-12-08 NOTE — Discharge Summary (Signed)
Name: Martha Spencer MRN: [id number] DOB: 1951/04/26 72 y.o. PCP: Patient, No Pcp Per  Date of Admission: 12/06/2022 10:01 AM Date of Discharge: 12/08/2022 8:58 PM Attending Physician: Dr. Antony Contras  Discharge Diagnosis: Principal Problem:   CVA (cerebral vascular accident) [facility])    Discharge Medications: Allergies as of 12/08/2022       Reactions   Sulfa Antibiotics    Unknown reaction   Wheat Other (See Comments)   [place] Free        Medication List     TAKE these medications    Aspirin Low Dose 81 MG chewable tablet Generic drug: aspirin Chew 1 tablet (81 mg total) by mouth daily. Start taking on: December 09, 2022   atorvastatin 80 MG tablet Commonly known as: LIPITOR Take 1 tablet (80 mg total) by mouth daily. Start taking on: December 09, 2022   clobetasol cream 0.05 % Commonly known as: TEMOVATE APPLY SPARINGLY TO AFFECTED AREA TWICE A DAY   clopidogrel 75 MG tablet Commonly known as: PLAVIX Take 1 tablet (75 mg total) by mouth daily. Start taking on: December 09, 2022   food thickener Gel Commonly known as: SIMPLYTHICK (NECTAR/LEVEL 2/MILDLY THICK) Add 1 packet to 4 ounces of liquid and take by mouth as directed as needed   venlafaxine XR 37.5 MG 24 hr capsule Commonly known as: EFFEXOR-XR Take 1 tablet by mouth daily.        Disposition and follow-up:   [name] was discharged from [facility] in Stable condition.  At the hospital follow up visit please address:  1.  Follow-up:  Neurology follow-up - Patient found to have an acute ischemic stroke in the left corona radiata, which was identified on MRI. Neurology was consulted and made their recommendations to see Dr. Pearlean Brownie at [facility] in 6-8 weeks. They are located at [address], [place] [address] (7 AM- 5 PM), [phone]. Follow up with patient regarding this appointment.    DAPT - Patient was started on Dual Antiplatelet Therapy (DAPT) to  decrease risk of repeat stroke. These medications include aspirin and clopidogrel (PLAVIX). Patient was given 18 pills of Plavix for 3 weeks total (starting 7/20-8/09). Follow up that she has enough refills for aspirin and atorvastatin, and any troubles with administration (crushing pill and taking it with a puree).   Blood pressure follow-up - Patient's blood pressures normalized <48 hrs of admission. Will follow up BP's in the office.    Outpatient therapies  - Patient to begin speech, occupational, and physical therapy in the outpatient setting. Follow up if services have started, any challenges related to getting to the appointments, and whether she is more comfortable with her swallowing. Per swallow study, safe to advance as tolerated: whole meds, thin liquids with sips, and regular diet.    2.  Labs / imaging needed at time of follow-up: CMP or RFP (need to check Cr.)  3.  Pending labs/ test needing follow-up: none   4.  Medication Changes  STOPPED   ADDED  - Aspirin 81  - Clopidogrel/Plavix 75  - Atorvastatin/Lipitor 80   MODIFIED   Follow-up Appointments:  Follow-up Information     Morene Crocker, MD Follow up.   Specialty: Internal Medicine Why: they will schedule an appointment Contact information: [address] [place] [address] [phone]         [facility] Follow up.   Specialty: Rehabilitation Contact information: [phone] [address]  [place] [address] [phone]                Hospital Course by problem list: Patient is a 72 y.o. with a history of depression who was admitted to the ED on 7/20 for speech changes. Only prescribed medication was venlafaxine at bedtime.   [facility] Thursday 7/18. Patient lives alone and reports that she noticed speech/swallowing changes yesterday. Per patient, had one episode of choking on coffee in the morning on 7/19. Didn't think much of it. While watching  TV, noticed she had difficulty repeating things accurately, voice sounded different. Woke up on 7/20, drank a few sips of coffee with no further choking, but became concerned when voice seemed slower and different than usual and called EMS. Endorses slight headache on admission but thinks it may be due to caffeine withdrawal since she limited her usual coffee intake to a few sips. Radene Journey stated patient's voice is slower and different from baseline. Patient denies falls, numbness, weakness, visual changes, or difficulties ambulating. Denies urinary incontinence or constipation.   CVA Acute ischemic stroke in left corona radiata  Dysarthria On admission, patient presented with R sided focal weakness in right hand and right lower extremity (slightly weaker with oppositional force compared to left), an episode of dysphagia, and dysarthric speech. Patient out of window for [facility] given [facility] Thursday 7/18. Neurology was consulted and patient was admitted for stroke/TIA workup.   CT head w/o contrast and MR brain w/o contrast showed acute infarct in the left corona radiata and extensive chronic small vessel ischemic disease. Patient was started on aspirin 81 and plavix 7. Patient's blood pressures remained elevated throughout period of permissive hypertension but did not require intervention with labetalol or hydralazine.    Patient's R sided weakness in hand strength, LE strength, and slowed coordination were stable on day one of admission. Blood pressures started to normalize. Speech continued to be dysarthric. SLP initially recommended acute inpatient rehab and a modified swallow study today given some trouble with bedside swallow study. TTE w/ bubble was performed, LVEF of 70 to 75% , no LV regional wall motion abnormalities (see full findings below). Patient was continued on aspirin and Plavix, added on atorvastatin 80 to prevent stroke (Cr. 0.81).     Pre-diabetes monitoring A1C 5.6, recommend  follow up outpatient for routine monitoring.    Hypertension Patient not formally diagnosed with hypertension, blood pressures intermittently low late first day of hospitalization but normalized to 120s-130s/70s-80s.    Depression Chronic, resumed home dose of Venlafaxine 37.5 mg. Monitor outpatient to determine if dose is appropriate or if patient desires therapy referral.    Discharge Subjective: Patient working with occupational therapy when team arrived for morning rounds. Patient denied CP, SOB, N/V, abdominal pain, dysuria, or constipation/diarrhea. Voice sounded smoother, more fluent today compared to yesterday and day of admission. Patient reports she is still taking it slow with feeding and swallowing. Discussed strategies she can use if she is anxious about her swallow function.   Discharge Exam:   Blood pressure 120/82, pulse 84, temperature 98.7 F (37.1 C), temperature source Oral, resp. rate 18, SpO2 95%.  Constitutional: well-appearing, sitting on the edge of the bed, in no acute distress HENT: normocephalic atraumatic, mucous membranes moist Cardiovascular: regular rate and rhythm, no m/r/g, no U/L edema Pulmonary/Chest: normal work of breathing on room air, lungs clear to auscultation bilaterally.  Abdominal: soft, non-tender, non-distended.  Neurological: alert & oriented x 3, right hand strength and coordination improved  compared to yesterday (still weaker/slower coordination compared to left hand), right lower extremity strength improved against oppositional force MSK: no gross abnormalities. Skin: warm and dry Psych: normal mood and affect  Pertinent Labs, Studies, and Procedures:     Latest Ref Rng & Units 12/08/2022    7:48 AM 12/07/2022    3:45 AM 12/06/2022   10:29 AM  CBC  WBC 4.0 - 10.5 K/uL 5.5  7.7  5.6   Hemoglobin 12.0 - 15.0 g/dL 54.0  98.1  19.1   Hematocrit 36.0 - 46.0 % 41.0  41.6  41.2   Platelets 150 - 400 K/uL 227  230  217        Latest Ref  Rng & Units 12/08/2022    7:48 AM 12/07/2022    3:45 AM 12/06/2022   10:29 AM  CMP  Glucose 70 - 99 mg/dL 92  478  96   BUN 8 - 23 mg/dL 20  13  14    Creatinine 0.44 - 1.00 mg/dL 2.95  6.21  3.08   Sodium 135 - 145 mmol/L 135  135  138   Potassium 3.5 - 5.1 mmol/L 3.8  3.5  3.8   Chloride 98 - 111 mmol/L 103  103  102   CO2 22 - 32 mmol/L 26  23  25    Calcium 8.9 - 10.3 mg/dL 8.8  9.0  9.2   Total Protein 6.5 - 8.1 g/dL 6.8  6.9  7.1   Total Bilirubin 0.3 - 1.2 mg/dL 1.1  0.9  0.7   Alkaline Phos 38 - 126 U/L 80  78  74   AST 15 - 41 U/L 21  23  27    ALT 0 - 44 U/L 21  23  24     DG SWALLOW FUNC SPEECH PATH 7/22 Patient's oropharyngeal function appears WFL. She effectively initiates and completes the swallow without any penetration/aspiration with any consistency adminstered.   ECHOCARDIOGRAM COMPLETE  Result Date: 12/07/2022    ECHOCARDIOGRAM REPORT   Patient Name:   Martha Spencer Date of Exam: 12/07/2022 Medical Rec #:  [id number]  Height:       67.0 in Accession #:    [id number] Weight:       148.0 lb Date of Birth:  1950-10-10  BSA:          1.779 m Patient Age:    72 years   BP:           141/87 mmHg Patient Gender: F          HR:           70 bpm. Exam Location:  Inpatient Procedure: 2D Echo, Cardiac Doppler and Color Doppler Indications:    Stroke  History:        Patient has no prior history of Echocardiogram examinations.  Sonographer:    Darlys Gales Referring Phys: [id number] CORTNEY E DE LA TORRE IMPRESSIONS  1. Left ventricular ejection fraction, by estimation, is 70 to 75%. The left ventricle has hyperdynamic function. The left ventricle has no regional wall motion abnormalities. There is mild left ventricular hypertrophy. Left ventricular diastolic parameters are indeterminate.  2. Right ventricular systolic function is normal. The right ventricular size is normal.  3. The mitral valve is normal in structure. Trivial mitral valve regurgitation.  4. The aortic valve is tricuspid. Aortic  valve regurgitation is mild. FINDINGS  Left Ventricle: Left ventricular ejection fraction, by estimation, is 70 to 75%. The left ventricle has hyperdynamic function. The  left ventricle has no regional wall motion abnormalities. The left ventricular internal cavity size was normal in size. There is mild left ventricular hypertrophy. Left ventricular diastolic parameters are indeterminate. Right Ventricle: The right ventricular size is normal. Right vetricular wall thickness was not assessed. Right ventricular systolic function is normal. Left Atrium: Left atrial size was normal in size. Right Atrium: Right atrial size was normal in size. Pericardium: Trivial pericardial effusion is present. Mitral Valve: The mitral valve is normal in structure. Trivial mitral valve regurgitation. Tricuspid Valve: The tricuspid valve is normal in structure. Tricuspid valve regurgitation is trivial. Aortic Valve: The aortic valve is tricuspid. Aortic valve regurgitation is mild. Aortic regurgitation PHT measures 604 msec. Aortic valve mean gradient measures 3.0 mmHg. Aortic valve peak gradient measures 4.5 mmHg. Aortic valve area, by VTI measures 2.48 cm. Pulmonic Valve: The pulmonic valve was not well visualized. Pulmonic valve regurgitation is not visualized. Aorta: The aortic root and ascending aorta are structurally normal, with no evidence of dilitation. IAS/Shunts: No atrial level shunt detected by color flow Doppler.  LEFT VENTRICLE PLAX 2D LVIDd:         3.20 cm   Diastology LVIDs:         2.10 cm   LV e' medial:    5.87 cm/s LV PW:         1.10 cm   LV E/e' medial:  9.1 LV IVS:        1.20 cm   LV e' lateral:   7.07 cm/s LVOT diam:     2.00 cm   LV E/e' lateral: 7.6 LV SV:         59 LV SV Index:   33 LVOT Area:     3.14 cm  RIGHT VENTRICLE             IVC RV S prime:     13.10 cm/s  IVC diam: 1.10 cm TAPSE (M-mode): 1.6 cm LEFT ATRIUM             Index        RIGHT ATRIUM          Index LA Vol (A2C):   29.0 ml 16.30 ml/m   RA Area:     7.53 cm LA Vol (A4C):   16.3 ml 9.16 ml/m   RA Volume:   12.90 ml 7.25 ml/m LA Biplane Vol: 22.4 ml 12.59 ml/m  AORTIC VALVE AV Area (Vmax):    2.68 cm AV Area (Vmean):   2.32 cm AV Area (VTI):     2.48 cm AV Vmax:           106.00 cm/s AV Vmean:          84.100 cm/s AV VTI:            0.239 m AV Peak Grad:      4.5 mmHg AV Mean Grad:      3.0 mmHg LVOT Vmax:         90.40 cm/s LVOT Vmean:        62.000 cm/s LVOT VTI:          0.189 m LVOT/AV VTI ratio: 0.79 AI PHT:            604 msec  AORTA Ao Root diam: 3.00 cm Ao Asc diam:  3.00 cm MITRAL VALVE MV Area (PHT): 3.00 cm    SHUNTS MV Decel Time: 253 msec    Systemic VTI:  0.19 m MV E velocity: 53.50 cm/s  Systemic Diam: 2.00 cm MV A velocity: 78.80 cm/s MV E/A ratio:  0.68 Dietrich Pates MD Electronically signed by Dietrich Pates MD Signature Date/Time: 12/07/2022/4:23:16 PM    Final    CT ANGIO HEAD NECK W WO CM  Result Date: 12/06/2022 CLINICAL DATA:  Stroke/TIA, determine embolic source EXAM: CT ANGIOGRAPHY HEAD AND NECK WITH AND WITHOUT CONTRAST TECHNIQUE: Multidetector CT imaging of the head and neck was performed using the standard protocol during bolus administration of intravenous contrast. Multiplanar CT image reconstructions and MIPs were obtained to evaluate the vascular anatomy. Carotid stenosis measurements (when applicable) are obtained utilizing NASCET criteria, using the distal internal carotid diameter as the denominator. RADIATION DOSE REDUCTION: This exam was performed according to the departmental dose-optimization program which includes automated exposure control, adjustment of the mA and/or kV according to patient size and/or use of iterative reconstruction technique. CONTRAST:  75mL OMNIPAQUE IOHEXOL 350 MG/ML SOLN COMPARISON:  CT Head 12/06/22, MR head 12/06/22 FINDINGS: CT HEAD FINDINGS See same day CT head for intracranial findings. No contrast-enhancing lesion is visualized. CTA NECK FINDINGS Aortic arch: Standard branching.  Imaged portion shows no evidence of aneurysm or dissection. No significant stenosis of the major arch vessel origins. Right carotid system: No evidence of dissection, stenosis (50% or greater), or occlusion. Left carotid system: No evidence of dissection, stenosis (50% or greater), or occlusion. Vertebral arteries: Codominant. No evidence of dissection, stenosis (50% or greater), or occlusion. Skeleton: Negative. Other neck: Negative. Upper chest: Negative. Review of the MIP images confirms the above findings CTA HEAD FINDINGS Anterior circulation: No significant stenosis, proximal occlusion, aneurysm, or vascular malformation. Posterior circulation: No significant stenosis, proximal occlusion, aneurysm, or vascular malformation. Venous sinuses: As permitted by contrast timing, patent. Anatomic variants: None Review of the MIP images confirms the above findings IMPRESSION: 1. No intracranial large vessel occlusion or significant stenosis. 2. No hemodynamically significant stenosis in the neck. 3. See same day CT head and brain MRI for intracranial findings. Electronically Signed   By: Lorenza Cambridge M.D.   On: 12/06/2022 17:30   MR BRAIN WO CONTRAST  Result Date: 12/06/2022 CLINICAL DATA:  Neuro deficit, acute, stroke suspected. Slurred speech. EXAM: MRI HEAD WITHOUT CONTRAST TECHNIQUE: Multiplanar, multiecho pulse sequences of the brain and surrounding structures were obtained without intravenous contrast. COMPARISON:  Head CT 12/06/2022 FINDINGS: Brain: There is a 2 cm acute infarct in the left corona radiata. No intracranial hemorrhage, mass, midline shift, or extra-axial fluid collection is identified. Patchy and confluent T2 hyperintensities in the cerebral white matter bilaterally and in the pons are nonspecific but compatible with extensive chronic small vessel ischemic disease. A chronic lacunar infarct is noted in the right corona radiata. The ventricles and sulci are within normal for age. Vascular:  Major intracranial vascular flow voids are preserved. Skull and upper cervical spine: Unremarkable bone marrow signal. Sinuses/Orbits: Unremarkable orbits. Paranasal sinuses and mastoid air cells are clear. Other: None. IMPRESSION: 1. Acute infarct in the left corona radiata. 2. Extensive chronic small vessel ischemic disease. Electronically Signed   By: Sebastian Ache M.D.   On: 12/06/2022 14:28   CT HEAD WO CONTRAST  Result Date: 12/06/2022 CLINICAL DATA:  Aphasia. EXAM: CT HEAD WITHOUT CONTRAST TECHNIQUE: Contiguous axial images were obtained from the base of the skull through the vertex without intravenous contrast. RADIATION DOSE REDUCTION: This exam was performed according to the departmental dose-optimization program which includes automated exposure control, adjustment of the mA and/or kV according to patient size and/or use of  iterative reconstruction technique. COMPARISON:  None Available. FINDINGS: Brain: No evidence of acute infarction, hemorrhage, hydrocephalus, extra-axial collection or mass lesion/mass effect. Extensive low-density in the cerebral white matter attributed to chronic small vessel ischemia. Vascular: No hyperdense vessel or unexpected calcification. Skull: Normal. Negative for fracture or focal lesion. Sinuses/Orbits: No acute finding. IMPRESSION: 1. No acute finding. 2. Chronic small vessel ischemia in the cerebral white matter. Electronically Signed   By: Tiburcio Pea M.D.   On: 12/06/2022 10:36     Discharge Instructions: Discharge Instructions     Ambulatory referral to Neurology   Complete by: As directed    An appointment is requested in approximately: 8 weeks   Ambulatory referral to Speech Therapy   Complete by: As directed    Call MD for:  difficulty breathing, headache or visual disturbances   Complete by: As directed    Call MD for:  extreme fatigue   Complete by: As directed    Call MD for:  hives   Complete by: As directed    Call MD for:  persistant  dizziness or light-headedness   Complete by: As directed    Call MD for:  persistant nausea and vomiting   Complete by: As directed    Call MD for:  redness, tenderness, or signs of infection (pain, swelling, redness, odor or green/yellow discharge around incision site)   Complete by: As directed    Call MD for:  severe uncontrolled pain   Complete by: As directed    Call MD for:  temperature >100.4   Complete by: As directed    Diet general   Complete by: As directed    No straws, small sips of liquids, can use nectar thickener to thicken liquids.   Discharge instructions   Complete by: As directed    PATIENT INSTRUCTIONS   - You were seen for an acute ischemic stroke in the left corona radiata, which was identified on MRI. Neurology was consulted and have made their recommendations, including the request to see Dr. Pearlean Brownie at [facility] in 6-8 weeks. They are located at [address], [place] [address] (7 AM- 5 PM), [phone]. Please give them a call to set up that appointment.   - Please start taking your medications as prescribed on 12/09/22. You were already given your doses for today.   - You were started on Dual Antiplatelet Therapy (DAPT) to decrease risk of repeat stroke. These medications include aspirin and clopidogrel (PLAVIX). You will continue to take both for 3 weeks, then you will take aspirin alone indefinitely.   - Your lipid panel showed elevated cholesterol and low-density lipoprotein. To help further reduce your risk of stroke and heart disease, we started you on atorvastatin (LIPITOR) daily. You will continue to take this medication.   - Your blood pressures were initially elevated after your stroke, but they have since normalized. We did not start you on an antihypertensive. We can check your blood pressure at your clinic appointment and follow-up on a need for medication. For now, you will not be discharged on any antihypertensive  medications.   - You were evaluated by physical therapy, occupational therapy, and speech therapy, and you will be receiving these services in the outpatient service. Social work will confirm Radiographer, therapeutic. We will follow up on these services at your clinic appointment to make sure they are confirmed.   - Speech therapy suggested the following: Crush medications and take with puree (like an applesauce).  You can crush a pill, take it with a spoonful of applesauce, then crush another pill and take with another spoonful, etc. You were advised to drink small sips of liquid at a time, or try thickening liquids with nectar thick thickener (available online). Please avoid straws as it makes the liquid go too fast. You can follow up with speech therapy at your next session how long you will have to do this for to continue swallowing safely.   - Your follow-up appointment with your new primary care provider, Dr. Denton Brick, has been scheduled for Monday, August 5th at 1:30 PM at the [facility], which is located in the basement of this hospital. We look forward to seeing you there!   - Please call our clinic during office hours (8:00 AM - 5 PM) at [phone] if you have any questions regarding your care. If you have a medical emergency, including any sudden worsening of your speech, changes in vision, weakness, confusion, chest pain, sharp headache, or any other acute change please call 911 immediately.   It was a pleasure serving you during your stay with [place]! - Dr. Justin Mend and team   Increase activity slowly   Complete by: As directed        Signed: Marely Apgar Colbert Coyer, MD [facility] - PGY1 Pager: [phone] 12/08/2022, 8:58 PM    Please contact the on call pager after 5 pm and on weekends at [phone].

## 2022-12-08 NOTE — Progress Notes (Signed)
Modified Barium Swallow Study  Patient Details  Name: Martha Spencer MRN: [id number] Date of Birth: Oct 16, 1950  Today's Date: 12/08/2022  Modified Barium Swallow completed.  Full report located under Chart Review in the Imaging Section.  History of Present Illness Patient is a 72 y.o. with a history of depression who was admitted to the ED for speech changes. Patient lives alone and reports that she noticed speech/swallowing changes yesterday. Per patient, had one episode of choking on coffee the morning of 7/18. While watching TV, noticed she had difficulty repeating things accurately, voice sounded different. Woke on 7/21, drank a few sips of coffee with no further choking, but became concerned when voice seemed slower and different than usual and called EMS. Endorses slight headache now but thinks it may be due to caffeine withdrawal since she limited her usual coffee intake to a few sips today. Patient denies falls, numbness, weakness, visual changes, or difficulties ambulating. Pt reports her voice has a lower speaking voice and moderate fatigue following more than a few minutes of speech. Pt reports no difficulty in thinking skills or word finding. The pt reports eating ice cream last night with no issue and has had a few sips of water since admission with no concerns.   Clinical Impression Pt reports difficulty swallowing and speech since being in the hospital, noted by coughing after liquids and increased difficulty clearing solids. She reports no difficulty on Dys 1 diet with nectar thick liquids. Pt has suspected osteophytes that do cause noted pharyngeal narrowing, although this does not seem to affect her swallowing function. Today, pt's oropharyngeal function appears WFL. She effectively initiates and completes the swallow without any penetration/aspiration with any consistency adminstered. Recommend regular texture diet with thin liquids and meds given whole. No further f/u necessary for  swallowing, although pt may benefit from ongoing ST for dysarthria on an outpatient basis. Factors that may increase risk of adverse event in presence of aspiration Martha Spencer & Martha Spencer 2021):    Swallow Evaluation Recommendations Recommendations: PO diet PO Diet Recommendation: Regular;Thin liquids (Level 0) Liquid Administration via: Cup;Straw Medication Administration: Whole meds with liquid Supervision: Patient able to self-feed Swallowing strategies  : Slow rate;Small bites/sips Postural changes: Position pt fully upright for meals;Stay upright 30-60 min after meals Oral care recommendations: Oral care BID (2x/day)      Gwynneth Aliment, M.A., CF-SLP Speech Language Pathology, Acute Rehabilitation Services  Secure Chat preferred [phone]  12/08/2022,10:05 AM

## 2022-12-08 NOTE — TOC Progression Note (Signed)
Transition of Care [facility]) - Progression Note    Patient Details  Name: Martha Spencer MRN: [id number] Date of Birth: Oct 28, 1950  [facility] CM/SW Contact  Nadene Rubins Adria Devon, RN Phone Number: 12/08/2022, 11:16 AM  Clinical Narrative:      Spoke to patient at beside.   Patient does not have a PCP. Attending team has agreed to schedule a follow up appointment for her in their clinic. Patient in agreement.   Patient has a friend who will provide transportation home today.   Discussed OP SP . Patient in agreement, plans to drive herself to OP SP patient asking if that is OK. NCM sent secure chat to team regarding same. Told patient will have MD discuss.   Order entered for OP SP for MD to sign  Expected Discharge Plan: Home/Self Care Barriers to Discharge: No Barriers Identified  Expected Discharge Plan and Services   Discharge Planning Services: CM Consult                     DME Arranged: N/A         HH Arranged: NA           Social Determinants of Health (SDOH) Interventions SDOH Screenings   Food Insecurity: No Food Insecurity (12/07/2022)  Housing: Low Risk  (12/07/2022)  Transportation Needs: No Transportation Needs (12/07/2022)  Utilities: Not At Risk (12/07/2022)  Tobacco Use: Low Risk  (12/06/2022)    Readmission Risk Interventions     No data to display

## 2022-12-11 NOTE — Therapy (Signed)
OUTPATIENT SPEECH LANGUAGE PATHOLOGY EVALUATION   Patient Name: Martha Spencer MRN: [id number] DOB:1950/07/10, 72 y.o., female Today's Date: 12/12/2022  PCP: Reymundo Poll, MD REFERRING PROVIDER: Reymundo Poll, MD   END OF SESSION:   End of Session - 12/12/22 0850     Visit Number 1    Number of Visits 9    Date for SLP Re-Evaluation 01/09/23    Authorization Type [employer] Medicare    SLP Start Time 0800    SLP Stop Time  0849    SLP Time Calculation (min) 49 min    Activity Tolerance Patient tolerated treatment well             Past Medical History:  Diagnosis Date   Anxiety    Depression    GERD (gastroesophageal reflux disease)    occ   Past Surgical History:  Procedure Laterality Date   ADENOIDECTOMY     COLONOSCOPY  2009   Dr Virginia Rochester -poor prep    EYE SURGERY     OOPHORECTOMY     WISDOM TOOTH EXTRACTION     Patient Active Problem List   Diagnosis Date Noted   CVA (cerebral vascular accident) (HCC) 12/06/2022   Chronic constipation 04/11/2013   Abdominal wall mass of right lower quadrant 04/05/2013    ONSET DATE: 12/08/22   REFERRING DIAG: I63.9 (ICD-10-CM) - Cerebrovascular accident (CVA), unspecified mechanism (HCC)   THERAPY DIAG:  No diagnosis found.  Rationale for Evaluation and Treatment: Rehabilitation  SUBJECTIVE:   SUBJECTIVE STATEMENT: "I had a stroke and my words don't come out."  Pt accompanied by: self  PERTINENT HISTORY: Patient lives alone and reports that she noticed speech/swallowing changes yesterday. Per patient, had one episode of choking on coffee the morning of 7/18. While watching TV, noticed she had difficulty repeating things accurately, voice sounded different. Woke on 7/21, drank a few sips of coffee with no further choking, but became concerned when voice seemed slower and different than usual and called EMS. Endorses slight headache now but thinks it may be due to caffeine withdrawal since she limited her usual coffee  intake to a few sips today. Patient denies falls, numbness, weakness, visual changes, or difficulties ambulating. Pt reports her voice has a lower speaking voice and moderate fatigue following more than a few minutes of speech. Pt reports no difficulty in thinking skills or word finding. The pt reports eating ice cream last night with no issue and has had a few sips of water since admission with no concerns.   PAIN:  Are you having pain? No  FALLS: Has patient fallen in last 6 months?  No  LIVING ENVIRONMENT: Lives with: lives with their family Lives in: House/apartment  PLOF:  Level of assistance: Independent with ADLs, Independent with IADLs Employment: Full-time employment  PATIENT GOALS: "To get back to normal as soon as I can."  OBJECTIVE:   INSTRUMENTAL SWALLOW STUDY FINDINGS (MBSS) 12/08/22 Clinical Impression: Pt notes difficulty swallowing since being in the hospital, noted by coughing after liquids and increased difficulty clearing solids. Reports no difficulty on Dys 1 diet with nectar thick liquids. Pt has suspected osteophytes that do cause noted pharyngeal narrowing, although this does not seem to affect her swallowing function. Today, pt's oropharyngeal function appears WFL. She effectively initiates and completes the swallow without any penetration/aspiration with any consistency adminstered. Recommend regular texture diet with thin liquids and meds given whole. No further f/u necessary for swallowing, although pt may benefit from ongoing ST for dysarthria on  an outpatient basis.  Objective swallow impairments: None noted.  Objective recommended compensations: Small sips, take time while eating.   COGNITION: Overall cognitive status: Within functional limits for tasks assessed  SUBJECTIVE DYSPHAGIA REPORTS:  Date of onset: 12/06/22 Reported symptoms: shortness of breath with PO and hoarseness  Current diet:  puree and "thicker" liquids   Co-morbid voice changes:  Yes  FACTORS WHICH MAY INCREASE RISK OF ADVERSE EVENT IN PRESENCE OF ASPIRATION:  General health: well appearing  Risk factors: none evident     ORAL MOTOR EXAMINATION: Overall status: WFL   CLINICAL SWALLOW ASSESSMENT:   Dentition: adequate natural dentition Vocal quality at baseline: normal Patient directly observed with POs: Yes: thin liquids  Feeding: able to feed self Liquids provided by: cup and straw Oral phase signs and symptoms: none evidenced Pharyngeal phase signs and symptoms: no overt s/sx of aspiration c/w instrumental findings   MOTOR SPEECH: assessed across variety of speech tasks: reading, word repetition, generative discourse sample Overall motor speech: impaired Level of impairment: Phrase, Sentence, and Conversation Rate of Speech: Variable Dysfluencies: stutter like dysfluencies (sound/syllable repetitions) Phonation: hoarse Conversational loudness average: 60 dB Voice Quality: hoarse and vocal fatigue Respiration: clavicular breathing Word and Phrasal Stress: atypical silences  Resonance: WFL Articulation: Impaired: phrase, sentence, and conversation Diadochokinetic Rate (DDK): slow rate and irregular rhythm, evidenced decreased stamina as task progresses Intelligibility: Intelligible Motor planning: Appears intact Interfering components:  muscle endurance/stamina while speaking Effective technique: over articulate, pacing, and energy conservation throughout the day, vocal hygiene  PATIENT REPORTED OUTCOME MEASURES (PROM): Speech Handicap Index: 44 (good to average) with main complaints of vocal fatigue throughout the day and increased effort throughout the day.   TODAY'S TREATMENT:                                                                                                                                          12/12/22: SLP provided education on pt MBSS results and recommendations. Encouraged pt to drink water to meet hydration needs and  progressing solids with use of energy conservation strategies and pacing to ensure full mastication in presence of fatigue. Pt verbalized understanding via teach back. Initiated education regarding use of dysarthria strategies using Clear Speech framework. Suggest pt focus on over-articulation vs vocal loudness in effort to modulate energy required to achieve more precise speech. Initiated HEP, see below.   HOME EXERCISE PROGRAM: Oral motor exercises, oral reading with focus on over-articulation  PATIENT EDUCATION: Education details: results and recommendations Person educated: Patient Education method: Explanation, Demonstration, and Handouts Education comprehension: verbalized understanding and returned demonstration   GOALS: Goals reviewed with patient? Yes  SHORT TERM GOALS: Target date: 01/02/2023  Pt will verbalize x3 energy conservation strategies   Baseline: Goal status: INITIAL  2.  Pt will demonstrate dysarthria strategies/compensations during structured language tasks with occasional min-A Baseline:  Goal status: INITIAL   LONG TERM  GOALS: Target date: 01/09/2023   Pt will carryover fatigue management strategies resulting in decreased impact of fatigue on speech intelligibility throughout the day Baseline:  Goal status: INITIAL  2.  Pt will demonstrate dysarthria strategies/compensations during 20 minute conversation with mod-I Baseline:  Goal status: INITIAL  3.  Pt will rate speech at 9/10 via visual analog scale by d/c Baseline: 8/10 (1 being no speech, 10 being baseline)  Goal status: INITIAL  ASSESSMENT:  CLINICAL IMPRESSION: Patient is a 72 y.o. 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 Evaluation reveals mild dysarthria. Pt's speech is c/b variable rate of speech, low vocal intensity, and imprecise articulation. Pt's deficits become more evident over utterance course and during expanded responses.  Pt reports fatigue is main complaint. As her day progresses, she needs to put forth more effort to have clear speech and speech becomes more difficult to understand. Endorses some improvement since acute onset. Using visual analog scale, pt rates speech as 5/10 at acute onset and has improved to 8/10 today. Education provided on how ST can address energy conservation, and compensations that can be put in place to better manage voice throughout the day, and to engage in self disclosure to facilitates effective communication with friends, colleagues, and students. Pt would benefit from skilled ST to address aforementioned deficits to enhance communication efficacy.   OBJECTIVE IMPAIRMENTS: include dysarthria. These impairments are limiting patient from return to work and effectively communicating at home and in community. Factors affecting potential to achieve goals and functional outcome are  none evidenced . Patient will benefit from skilled SLP services to address above impairments and improve overall function.  REHAB POTENTIAL: Good  PLAN:  SLP FREQUENCY: 1-2x/week  SLP DURATION: 4 weeks  PLANNED INTERVENTIONS: Environmental controls, Cueing hierachy, Oral motor exercises, Functional tasks, SLP instruction and feedback, and Compensatory strategies    [place], Student-SLP 12/12/2022, 8:03 AM

## 2022-12-12 ENCOUNTER — Ambulatory Visit: Payer: Medicare Other | Attending: Internal Medicine | Admitting: Speech Pathology

## 2022-12-12 ENCOUNTER — Encounter: Payer: Self-pay | Admitting: Speech Pathology

## 2022-12-12 ENCOUNTER — Other Ambulatory Visit: Payer: Self-pay

## 2022-12-12 DIAGNOSIS — R471 Dysarthria and anarthria: Secondary | ICD-10-CM | POA: Insufficient documentation

## 2022-12-12 DIAGNOSIS — I639 Cerebral infarction, unspecified: Secondary | ICD-10-CM | POA: Diagnosis not present

## 2022-12-12 NOTE — Patient Instructions (Signed)
To use clear speech  Take your time and over articulate.   Purpose: To improve lip and tongue strength in order to help people understand you. These exercises can be done while looking in a mirror.  Do them twice a day.  Lips  1. Press hard and briefly hold all the beginning sounds in these words: Repeat each set 2 times     "ma ma ma"    "boo boo boo"  "pa pa pa."          "moo moo moo" "bye bye bye"  "pie pie pie"          "me me me"  "bee bee bee"  "pea pea pea"   2. Pucker your lips tightly and say "OOOO," then smile wide and say "EEEE."  Repeat _2_ times.  3. Practice whistling for __30__ seconds.  4. "Blow out" candles.  Repeat _10___ times.  5. Blow kisses - make them LOUD!  Repeat __10__ times.   Tip of Tongue  1. Say the sound  "ta ta ta," "la la la,"          and "Research scientist (physical sciences)."  Repeat __2__ times.               "tee tee tee" "lee lee lee"   "dee dee dee"        "too too too" "loo loo loo"  "do do do"  2. Say "time," "took," "take," "town," and "Tom."  Repeat __2__ times.  3. Say "long," "look," "low," "lay" and "lie."  Repeat _2__ times.  4. Say "name," "neck," "not," "new," and "no."  Repeat __2_ times.  5. Say "down," "dip," "dot," "Don," and "do."  Repeat __2__ times   Back of Tongue  1. Say the sounds "ka ka ka" and "go go go."   Repeat ___2_ times.       "cow cow cow" "guy guy guy"       "koo koo koo" "goo goo goo"  2. Say "kick," "cake," "Jae [place]ire," "key," "keep," "kite," and "Ken."  Repeat __2__ times.  3. Say "gate," "gag," "got," "get," "gone," and "go."  Repeat __2__ times.

## 2022-12-14 NOTE — Therapy (Signed)
OUTPATIENT SPEECH LANGUAGE PATHOLOGY TREATMENT   Patient Name: Martha Spencer MRN: [id number] DOB:Nov 25, 1950, 72 y.o., female Today's Date: 12/15/2022  PCP: Reymundo Poll, MD REFERRING PROVIDER: Reymundo Poll, MD   END OF SESSION:   End of Session - 12/15/22 0848     Visit Number 2    Number of Visits 9    Date for SLP Re-Evaluation 01/09/23    Authorization Type [employer] Medicare    SLP Start Time 0848    SLP Stop Time  0933    SLP Time Calculation (min) 45 min    Activity Tolerance Patient tolerated treatment well              Past Medical History:  Diagnosis Date   Anxiety    Depression    GERD (gastroesophageal reflux disease)    occ   Past Surgical History:  Procedure Laterality Date   ADENOIDECTOMY     COLONOSCOPY  2009   Dr Virginia Rochester -poor prep    EYE SURGERY     OOPHORECTOMY     WISDOM TOOTH EXTRACTION     Patient Active Problem List   Diagnosis Date Noted   CVA (cerebral vascular accident) (HCC) 12/06/2022   Chronic constipation 04/11/2013   Abdominal wall mass of right lower quadrant 04/05/2013    ONSET DATE: 12/08/22  REFERRING DIAG: I63.9 (ICD-10-CM) - Cerebrovascular accident (CVA), unspecified mechanism (HCC)  THERAPY DIAG: Dysarthria and anarthria  Rationale for Evaluation and Treatment: Rehabilitation  SUBJECTIVE:   SUBJECTIVE STATEMENT: "One thing that is hard is the emotional part."  Pt accompanied by: self  PERTINENT HISTORY: Patient lives alone and reports that she noticed speech/swallowing changes yesterday. Per patient, had one episode of choking on coffee the morning of 7/18. While watching TV, noticed she had difficulty repeating things accurately, voice sounded different. Woke on 7/21, drank a few sips of coffee with no further choking, but became concerned when voice seemed slower and different than usual and called EMS. Endorses slight headache now but thinks it may be due to caffeine withdrawal since she limited her usual coffee  intake to a few sips today. Patient denies falls, numbness, weakness, visual changes, or difficulties ambulating. Pt reports her voice has a lower speaking voice and moderate fatigue following more than a few minutes of speech. Pt reports no difficulty in thinking skills or word finding. The pt reports eating ice cream last night with no issue and has had a few sips of water since admission with no concerns.   PAIN:  Are you having pain? No  FALLS: Has patient fallen in last 6 months?  No  LIVING ENVIRONMENT: Lives with: lives with their family Lives in: House/apartment  PLOF:  Level of assistance: Independent with ADLs, Independent with IADLs Employment: Full-time employment  PATIENT GOALS: "To get back to normal as soon as I can."  OBJECTIVE:   INSTRUMENTAL SWALLOW STUDY FINDINGS (MBSS) 12/08/22 Clinical Impression: Pt notes difficulty swallowing since being in the hospital, noted by coughing after liquids and increased difficulty clearing solids. Reports no difficulty on Dys 1 diet with nectar thick liquids. Pt has suspected osteophytes that do cause noted pharyngeal narrowing, although this does not seem to affect her swallowing function. Today, pt's oropharyngeal function appears WFL. She effectively initiates and completes the swallow without any penetration/aspiration with any consistency adminstered. Recommend regular texture diet with thin liquids and meds given whole. No further f/u necessary for swallowing, although pt may benefit from ongoing ST for dysarthria on an outpatient basis.  Objective swallow impairments: None noted.  Objective recommended compensations: Small sips, take time while eating.   PATIENT REPORTED OUTCOME MEASURES (PROM): Speech Handicap Index: 44 (good to average) with main complaints of vocal fatigue throughout the day and increased effort throughout the day.   TODAY'S TREATMENT:                                                                                                                                           12-15-22: Reviewing HEP from previous session, patient states that it went well. Demonstrated with more fluent speech during today's session when compared with initial evaluation a few days ago. Pt states that swallowing has gotten better, and that she is no longer avoiding water. 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 Targeted dysarthria through motor speech warmup, patient required occasional min-A of models and immediate feedback. Patient benefited from slow rate to focus on over articulation. Patient led through oral reading exercises at paragraph level, with occasional verbal cues to slow down and focus on over articulation. Intelligibility increased when pt intentionally slowed rate. Pt demonstrated difficulty with /s/ sounds. HEP provided for oral reading for passages with frequent /s/ sounds and multi-syllable words. All questions answered to patient satisfaction.   12/12/22: SLP provided education on pt MBSS results and recommendations. Encouraged pt to drink water to meet hydration needs and progressing solids with use of energy conservation strategies and pacing to ensure full mastication in presence of fatigue. Pt verbalized understanding via teach back. Initiated education regarding use of dysarthria strategies using Clear Speech framework. Suggest pt focus on over-articulation vs vocal loudness in effort to modulate energy required to achieve more precise speech. Initiated HEP, see below.   HOME EXERCISE PROGRAM: Oral motor exercises, oral reading with focus on over-articulation  PATIENT EDUCATION: Education details: results and recommendations Person educated: Patient Education method: Explanation, Demonstration, and Handouts Education  comprehension: verbalized understanding and returned demonstration   GOALS: Goals reviewed with patient? Yes  SHORT TERM GOALS: Target date: 01/02/2023  Pt will verbalize x3 energy conservation strategies   Baseline: Goal status: IN PROGRESS  2.  Pt will demonstrate dysarthria strategies/compensations during structured language tasks with occasional min-A Baseline:  Goal status: IN PROGRESS   LONG TERM GOALS: Target date: 01/09/2023   Pt will carryover fatigue management strategies resulting in decreased impact of fatigue on speech intelligibility throughout the day Baseline:  Goal status: IN PROGRESS  2.  Pt will demonstrate dysarthria strategies/compensations during 20 minute conversation with mod-I Baseline:  Goal status: IN PROGRESS  3.  Pt will rate speech at 9/10 via visual analog scale by d/c Baseline: 8/10 (1 being no speech, 10 being baseline)  Goal status: IN PROGRESS  ASSESSMENT:  CLINICAL IMPRESSION: Patient is a 73 y.o. 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 Evaluation reveals mild dysarthria. Pt's speech is c/b variable rate of speech, low vocal intensity, and imprecise articulation. Pt's deficits become more evident over utterance course and during expanded responses. Pt reports fatigue is main complaint. As her day progresses, she needs to put forth more effort to have clear speech and speech becomes more difficult to understand. Endorses some improvement since acute onset. Using visual analog scale, pt rates speech as 5/10 at acute onset and has improved to 8/10 today. Education provided on how ST can address energy conservation, and compensations that can be put in place to better manage voice throughout the day, and to engage in self disclosure to facilitates effective communication with friends, colleagues, and students. Pt would benefit from skilled ST to address aforementioned deficits to enhance  communication efficacy.   OBJECTIVE IMPAIRMENTS: include dysarthria. These impairments are limiting patient from return to work and effectively communicating at home and in community. Factors affecting potential to achieve goals and functional outcome are  none evidenced . Patient will benefit from skilled SLP services to address above impairments and improve overall function.  REHAB POTENTIAL: Good  PLAN:  SLP FREQUENCY: 1-2x/week  SLP DURATION: 4 weeks  PLANNED INTERVENTIONS: Environmental controls, Cueing hierachy, Oral motor exercises, Functional tasks, SLP instruction and feedback, and Compensatory strategies    Gracy Racer, CCC-SLP 12/15/2022, 11:10 AM

## 2022-12-15 ENCOUNTER — Ambulatory Visit: Payer: Medicare Other

## 2022-12-15 DIAGNOSIS — R471 Dysarthria and anarthria: Secondary | ICD-10-CM | POA: Diagnosis not present

## 2022-12-16 ENCOUNTER — Other Ambulatory Visit (HOSPITAL_COMMUNITY): Payer: Self-pay

## 2022-12-19 ENCOUNTER — Ambulatory Visit: Payer: Medicare Other | Attending: Internal Medicine | Admitting: Speech Pathology

## 2022-12-19 DIAGNOSIS — R471 Dysarthria and anarthria: Secondary | ICD-10-CM | POA: Insufficient documentation

## 2022-12-19 NOTE — Therapy (Signed)
OUTPATIENT SPEECH LANGUAGE PATHOLOGY TREATMENT   Patient Name: Martha Spencer MRN: [id number] DOB:November 28, 1950, 72 y.o., female Today's Date: 12/19/2022  PCP: Reymundo Poll, MD REFERRING PROVIDER: Reymundo Poll, MD   END OF SESSION:   End of Session - 12/19/22 0855     Visit Number 3    Number of Visits 9    Date for SLP Re-Evaluation 01/09/23    Authorization Type [employer]    SLP Start Time 0845    SLP Stop Time  0930    SLP Time Calculation (min) 45 min    Activity Tolerance Patient tolerated treatment well              Past Medical History:  Diagnosis Date   Anxiety    Depression    GERD (gastroesophageal reflux disease)    occ   Past Surgical History:  Procedure Laterality Date   ADENOIDECTOMY     COLONOSCOPY  2009   Dr Virginia Rochester -poor prep    EYE SURGERY     OOPHORECTOMY     WISDOM TOOTH EXTRACTION     Patient Active Problem List   Diagnosis Date Noted   CVA (cerebral vascular accident) (HCC) 12/06/2022   Chronic constipation 04/11/2013   Abdominal wall mass of right lower quadrant 04/05/2013    ONSET DATE: 12/08/22  REFERRING DIAG: I63.9 (ICD-10-CM) - Cerebrovascular accident (CVA), unspecified mechanism (HCC)  THERAPY DIAG: Dysarthria and anarthria  Rationale for Evaluation and Treatment: Rehabilitation  SUBJECTIVE:   SUBJECTIVE STATEMENT: 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  PERTINENT HISTORY: Patient lives alone and reports that she noticed speech/swallowing changes yesterday. Per patient, had one episode of choking on coffee the morning of 7/18. While watching TV, noticed she had difficulty repeating things accurately, voice sounded different. Woke on 7/21, drank a few sips of coffee with no further choking, but became concerned when voice seemed slower and different than usual and called EMS. Endorses slight headache now but thinks it may be due to caffeine withdrawal since she limited her usual coffee intake to a few sips  today. Patient denies falls, numbness, weakness, visual changes, or difficulties ambulating. Pt reports her voice has a lower speaking voice and moderate fatigue following more than a few minutes of speech. Pt reports no difficulty in thinking skills or word finding. The pt reports eating ice cream last night with no issue and has had a few sips of water since admission with no concerns.   PAIN:  Are you having pain? No  FALLS: Has patient fallen in last 6 months?  No  LIVING ENVIRONMENT: Lives with: lives with their family Lives in: House/apartment  PLOF:  Level of assistance: Independent with ADLs, Independent with IADLs Employment: Full-time employment  PATIENT GOALS: "To get back to normal as soon as I can."  OBJECTIVE:   INSTRUMENTAL SWALLOW STUDY FINDINGS (MBSS) 12/08/22 Clinical Impression: Pt notes difficulty swallowing since being in the hospital, noted by coughing after liquids and increased difficulty clearing solids. Reports no difficulty on Dys 1 diet with nectar thick liquids. Pt has suspected osteophytes that do cause noted pharyngeal narrowing, although this does not seem to affect her swallowing function. Today, pt's oropharyngeal function appears WFL. She effectively initiates and completes the swallow without any penetration/aspiration with any consistency adminstered. Recommend regular texture diet with thin liquids and meds given whole. No further f/u necessary for swallowing, although pt may benefit from ongoing ST for dysarthria on an outpatient basis.  Objective swallow impairments: None  noted.  Objective recommended compensations: Small sips, take time while eating.   PATIENT REPORTED OUTCOME MEASURES (PROM): Speech Handicap Index: 44 (good to average) with main complaints of vocal fatigue throughout the day and increased effort throughout the day.   TODAY'S TREATMENT:                                                                                                                                           12/19/2022: Pt with teach back of faciliative dysarthria strategies and compensations which A in successful return to work this past week. Was fatigued by third day of lessons, tells SLP it was not just speaking but also stress associated with being present and attentive to students. Is looking forward to fall when lessons will be more spaced out throughout the week. Reports self-disclosure was beneficial and no overt communication breakdowns occurred d/t dysarthria. Addressed employment of dysarthria strategies with focus on over-articulation and slow rate during production of tongue twisters. Pt with 90% accuracy across trials, demonstrating awareness of increased rate and ability to self-correct with rare min-A. Target carryover of skills during structured conversation with pt again demonstrating awareness and aiblity to self-correct. 100% intelligible. Endorses continuation of eating softer solids. Some hypogeusia with savory or salty foods. Reviewed swallow compensations for liquids and solids, with pt verbalizing understanding via teach back.   12-15-22: Reviewing HEP from previous session, patient states that it went well. Demonstrated with more fluent speech during today's session when compared with initial evaluation a few days ago. Pt states that swallowing has gotten better, and that she is no longer avoiding water. 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 Targeted dysarthria through motor speech warmup, patient required occasional min-A of models and immediate feedback. Patient benefited from slow rate to focus on over articulation. Patient led through oral reading exercises at paragraph level, with occasional verbal cues to slow down and focus on over articulation. Intelligibility  increased when pt intentionally slowed rate. Pt demonstrated difficulty with /s/ sounds. HEP provided for oral reading for passages with frequent /s/ sounds and multi-syllable words. All questions answered to patient satisfaction.   12/12/22: SLP provided education on pt MBSS results and recommendations. Encouraged pt to drink water to meet hydration needs and progressing solids with use of energy conservation strategies and pacing to ensure full mastication in presence of fatigue. Pt verbalized understanding via teach back. Initiated education regarding use of dysarthria strategies using Clear Speech framework. Suggest pt focus on over-articulation vs vocal loudness in effort to modulate energy required to achieve more precise speech. Initiated HEP, see below.   HOME EXERCISE PROGRAM: Oral motor exercises, oral reading with focus on over-articulation  PATIENT EDUCATION: Education details: results and recommendations  Person educated: Patient Education method: Explanation, Demonstration, and Handouts Education comprehension: verbalized understanding and returned demonstration   GOALS: Goals reviewed with patient? Yes  SHORT TERM GOALS: Target date: 01/02/2023  Pt will verbalize x3 energy conservation strategies   Baseline: Goal status: IN PROGRESS  2.  Pt will demonstrate dysarthria strategies/compensations during structured language tasks with occasional min-A Baseline: 12/19/22 Goal status: IN PROGRESS   LONG TERM GOALS: Target date: 01/09/2023   Pt will carryover fatigue management strategies resulting in decreased impact of fatigue on speech intelligibility throughout the day Baseline:  Goal status: IN PROGRESS  2.  Pt will demonstrate dysarthria strategies/compensations during 20 minute conversation with mod-I Baseline:  Goal status: IN PROGRESS  3.  Pt will rate speech at 9/10 via visual analog scale by d/c Baseline: 8/10 (1 being no speech, 10 being baseline)  Goal status:  IN PROGRESS  ASSESSMENT:  CLINICAL IMPRESSION: Patient is a 72 y.o. 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 Evaluation reveals mild dysarthria. Pt's speech is c/b variable rate of speech, low vocal intensity, and imprecise articulation. Pt's deficits become more evident over utterance course and during expanded responses. Pt reports fatigue is main complaint. As her day progresses, she needs to put forth more effort to have clear speech and speech becomes more difficult to understand. Endorses some improvement since acute onset. Using visual analog scale, pt rates speech as 5/10 at acute onset and has improved to 8/10 today. Education provided on how ST can address energy conservation, and compensations that can be put in place to better manage voice throughout the day, and to engage in self disclosure to facilitates effective communication with friends, colleagues, and students. Pt would benefit from skilled ST to address aforementioned deficits to enhance communication efficacy.   OBJECTIVE IMPAIRMENTS: include dysarthria. These impairments are limiting patient from return to work and effectively communicating at home and in community. Factors affecting potential to achieve goals and functional outcome are  none evidenced . Patient will benefit from skilled SLP services to address above impairments and improve overall function.  REHAB POTENTIAL: Good  PLAN:  SLP FREQUENCY: 1-2x/week  SLP DURATION: 4 weeks  PLANNED INTERVENTIONS: Environmental controls, Cueing hierachy, Oral motor exercises, Functional tasks, SLP instruction and feedback, and Compensatory strategies    Maia Breslow, CCC-SLP 12/19/2022, 9:34 AM

## 2022-12-21 NOTE — Therapy (Signed)
OUTPATIENT SPEECH LANGUAGE PATHOLOGY TREATMENT   Patient Name: Martha Spencer MRN: [id number] DOB:1950-07-14, 72 y.o., female Today's Date: 12/22/2022  PCP: Reymundo Poll, MD REFERRING PROVIDER: Reymundo Poll, MD   END OF SESSION:   End of Session - 12/22/22 0852     Visit Number 4    Number of Visits 9    Date for SLP Re-Evaluation 01/09/23    Authorization Type [employer] Medicare    SLP Start Time 0850    SLP Stop Time  0930    SLP Time Calculation (min) 40 min    Activity Tolerance Patient tolerated treatment well               Past Medical History:  Diagnosis Date   Anxiety    Depression    GERD (gastroesophageal reflux disease)    occ   Past Surgical History:  Procedure Laterality Date   ADENOIDECTOMY     COLONOSCOPY  2009   Dr Virginia Rochester -poor prep    EYE SURGERY     OOPHORECTOMY     WISDOM TOOTH EXTRACTION     Patient Active Problem List   Diagnosis Date Noted   CVA (cerebral vascular accident) (HCC) 12/06/2022   Chronic constipation 04/11/2013   Abdominal wall mass of right lower quadrant 04/05/2013    ONSET DATE: 12/08/22  REFERRING DIAG: I63.9 (ICD-10-CM) - Cerebrovascular accident (CVA), unspecified mechanism (HCC)  THERAPY DIAG: Dysarthria and anarthria  Rationale for Evaluation and Treatment: Rehabilitation  SUBJECTIVE:   SUBJECTIVE STATEMENT: "I played a little Cello"   PERTINENT HISTORY: Patient lives alone and reports that she noticed speech/swallowing changes yesterday. Per patient, had one episode of choking on coffee the morning of 7/18. While watching TV, noticed she had difficulty repeating things accurately, voice sounded different. Woke on 7/21, drank a few sips of coffee with no further choking, but became concerned when voice seemed slower and different than usual and called EMS. Endorses slight headache now but thinks it may be due to caffeine withdrawal since she limited her usual coffee intake to a few sips today. Patient denies  falls, numbness, weakness, visual changes, or difficulties ambulating. Pt reports her voice has a lower speaking voice and moderate fatigue following more than a few minutes of speech. Pt reports no difficulty in thinking skills or word finding. The pt reports eating ice cream last night with no issue and has had a few sips of water since admission with no concerns.   PAIN:  Are you having pain? No  FALLS: Has patient fallen in last 6 months?  No  LIVING ENVIRONMENT: Lives with: lives with their family Lives in: House/apartment  PLOF:  Level of assistance: Independent with ADLs, Independent with IADLs Employment: Full-time employment  PATIENT GOALS: "To get back to normal as soon as I can."  OBJECTIVE:   INSTRUMENTAL SWALLOW STUDY FINDINGS (MBSS) 12/08/22 Clinical Impression: Pt notes difficulty swallowing since being in the hospital, noted by coughing after liquids and increased difficulty clearing solids. Reports no difficulty on Dys 1 diet with nectar thick liquids. Pt has suspected osteophytes that do cause noted pharyngeal narrowing, although this does not seem to affect her swallowing function. Today, pt's oropharyngeal function appears WFL. She effectively initiates and completes the swallow without any penetration/aspiration with any consistency adminstered. Recommend regular texture diet with thin liquids and meds given whole. No further f/u necessary for swallowing, although pt may benefit from ongoing ST for dysarthria on an outpatient basis.  Objective swallow impairments: None noted.  Objective recommended compensations: Small sips, take time while eating.   PATIENT REPORTED OUTCOME MEASURES (PROM): Speech Handicap Index: 44 (good to average) with main complaints of vocal fatigue throughout the day and increased effort throughout the day.   TODAY'S TREATMENT:                                                                                                                                           12/22/22: Reported continued improvements in speech clarity, which mildly reduces when fatigued. Continues with taking breaks and focusing on talking when most rested. Re-iterated importance of intentionally compensating when fatigued. Targeted generation of sentences of multi-syllabic words, in which pt required rare min A to over-articulate and slow rate. Intermittent self-corrections exhibited in conversation >25+ minutes. Pt remained 100% intelligible although mild fatigue indicated. Continue to recommended frequent practice to aid endurance and carryover of strategies. Reported return to regular diet, with exception of dry textures, with no issues endorsed.   12/19/2022: Pt with teach back of faciliative dysarthria strategies and compensations which A in successful return to work this past week. Was fatigued by third day of lessons, tells SLP it was not just speaking but also stress associated with being present and attentive to students. Is looking forward to fall when lessons will be more spaced out throughout the week. Reports self-disclosure was beneficial and no overt communication breakdowns occurred d/t dysarthria. Addressed employment of dysarthria strategies with focus on over-articulation and slow rate during production of tongue twisters. Pt with 90% accuracy across trials, demonstrating awareness of increased rate and ability to self-correct with rare min-A. Target carryover of skills during structured conversation with pt again demonstrating awareness and aiblity to self-correct. 100% intelligible. Endorses continuation of eating softer solids. Some hypogeusia with savory or salty foods. Reviewed swallow compensations for liquids and solids, with pt verbalizing understanding via teach back.   12-15-22: Reviewing HEP from previous session, patient states that it went well. Demonstrated with more fluent speech during today's session when compared with initial evaluation a few days  ago. Pt states that swallowing has gotten better, and that she is no longer avoiding water. 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 Targeted dysarthria through motor speech warmup, patient required occasional min-A of models and immediate feedback. Patient benefited from slow rate to focus on over articulation. Patient led through oral reading exercises at paragraph level, with occasional verbal cues to slow down and focus on over articulation. Intelligibility increased when pt intentionally slowed rate. Pt demonstrated difficulty with /s/ sounds. HEP provided for oral reading for passages with frequent /s/ sounds and multi-syllable words. All questions answered to patient satisfaction.   12/12/22: SLP provided education on pt MBSS results and recommendations.  Encouraged pt to drink water to meet hydration needs and progressing solids with use of energy conservation strategies and pacing to ensure full mastication in presence of fatigue. Pt verbalized understanding via teach back. Initiated education regarding use of dysarthria strategies using Clear Speech framework. Suggest pt focus on over-articulation vs vocal loudness in effort to modulate energy required to achieve more precise speech. Initiated HEP, see below.   HOME EXERCISE PROGRAM: Oral motor exercises, oral reading with focus on over-articulation  PATIENT EDUCATION: Education details: results and recommendations Person educated: Patient Education method: Explanation, Demonstration, and Handouts Education comprehension: verbalized understanding and returned demonstration   GOALS: Goals reviewed with patient? Yes  SHORT TERM GOALS: Target date: 01/02/2023  Pt will verbalize x3 energy conservation strategies   Baseline: Goal status: IN PROGRESS  2.  Pt  will demonstrate dysarthria strategies/compensations during structured language tasks with occasional min-A Baseline: 12/19/22, 12-22-22 Goal status: IN PROGRESS   LONG TERM GOALS: Target date: 01/09/2023   Pt will carryover fatigue management strategies resulting in decreased impact of fatigue on speech intelligibility throughout the day Baseline:  Goal status: IN PROGRESS  2.  Pt will demonstrate dysarthria strategies/compensations during 20 minute conversation with mod-I Baseline:  Goal status: IN PROGRESS  3.  Pt will rate speech at 9/10 via visual analog scale by d/c Baseline: 8/10 (1 being no speech, 10 being baseline)  Goal status: IN PROGRESS  ASSESSMENT:  CLINICAL IMPRESSION: Patient is a 72 y.o. 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 Evaluation reveals mild dysarthria. Pt's speech is c/b variable rate of speech, low vocal intensity, and imprecise articulation. Pt's deficits become more evident over utterance course and during expanded responses. Pt reports fatigue is main complaint. As her day progresses, she needs to put forth more effort to have clear speech and speech becomes more difficult to understand. Endorses some improvement since acute onset. Using visual analog scale, pt rates speech as 5/10 at acute onset and has improved to 8/10 today. Education provided on how ST can address energy conservation, and compensations that can be put in place to better manage voice throughout the day, and to engage in self disclosure to facilitates effective communication with friends, colleagues, and students. Pt would benefit from skilled ST to address aforementioned deficits to enhance communication efficacy.   OBJECTIVE IMPAIRMENTS: include dysarthria. These impairments are limiting patient from return to work and effectively communicating at home and in community. Factors affecting potential to achieve goals and functional  outcome are  none evidenced . Patient will benefit from skilled SLP services to address above impairments and improve overall function.  REHAB POTENTIAL: Good  PLAN:  SLP FREQUENCY: 1-2x/week  SLP DURATION: 4 weeks  PLANNED INTERVENTIONS: Environmental controls, Cueing hierachy, Oral motor exercises, Functional tasks, SLP instruction and feedback, and Compensatory strategies    Gracy Racer, CCC-SLP 12/22/2022, 10:17 AM

## 2022-12-22 ENCOUNTER — Ambulatory Visit (INDEPENDENT_AMBULATORY_CARE_PROVIDER_SITE_OTHER): Payer: Medicare Other | Admitting: Student

## 2022-12-22 ENCOUNTER — Encounter: Payer: Self-pay | Admitting: Student

## 2022-12-22 ENCOUNTER — Ambulatory Visit: Payer: Medicare Other

## 2022-12-22 VITALS — BP 149/94 | HR 70 | Temp 98.0°F | Ht 67.0 in | Wt 153.8 lb

## 2022-12-22 DIAGNOSIS — R471 Dysarthria and anarthria: Secondary | ICD-10-CM | POA: Diagnosis not present

## 2022-12-22 DIAGNOSIS — I6381 Other cerebral infarction due to occlusion or stenosis of small artery: Secondary | ICD-10-CM | POA: Diagnosis not present

## 2022-12-22 DIAGNOSIS — F32A Depression, unspecified: Secondary | ICD-10-CM | POA: Diagnosis not present

## 2022-12-22 DIAGNOSIS — I10 Essential (primary) hypertension: Secondary | ICD-10-CM

## 2022-12-22 MED ORDER — AMLODIPINE-ATORVASTATIN 5-80 MG PO TABS: 1.0000 | ORAL_TABLET | Freq: Every day | ORAL | 11 refills | Status: DC

## 2022-12-22 NOTE — Assessment & Plan Note (Signed)
Patient has continued taking aspirin 81 and plavix 75 daily. Per neurology, plan was to take for 3 weeks (7/20-8/09) together, then stop plavix and continue taking aspirin 81 daily indefinitely. Patient reports she has 4 plavix left (enough for once daily until end date 8/09). Has noticed increased bruising (especially on arms) but reports no falls. Patient was also started on atorvastatin 80 daily during admission to help with risk stratification following stroke. Denies any side effects like muscle pain, nausea, bloating, or dizziness. Follow up appointment with Dr. Teresa Coombs (neurology) is scheduled for 10/09 at 9:45 AM.   Patient has made significant progress with speech therapy. Her speech today is closer to her baseline. Has 5 sessions left (has completed 4/9 total). Has been eating a regular diet and is able to swallow pills without any problem. No more choking episodes since admission. No other focal deficits on neuro exam today, patient alert and oriented to self, date, place, and situation. 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   Will repeat BMP at next visit as well as TSH for regular monitoring. TSH slightly elevated during admission, no history of hypothyroidism or hyperthyroidism.   Plan:  - Finish taking Plavix, expected end date 8/09 - Continue Aspirin 81 daily  - Continue Atorvastatin 80 daily, repeat BMP next visit - Continue going to speech therapy sessions  - Follow up with neurology

## 2022-12-22 NOTE — Assessment & Plan Note (Signed)
Patient with history of depression. Has been taking venlafaxine daily for many years, with patient adjusting doses between 37.5 mg daily up to 150 mg daily. Patient reports she has been feeling more sad/upset by her stroke, especially some of the changes she has had to make in her life (reduced work schedule). She is encouraged by the progress she has made in speech therapy. Became teary eyed discussing having to take additional medications for her blood pressure. Per patient, she recently increased her dose of venlafaxine from 37.5 mg daily to 75 mg daily. Has been taking this dose for about a week and reports improved mood. Denies N/V, diarrhea, dizziness, or abdominal pain but does endorse dry mouth. Today, patient scored a 5 on the PHQ-9, indicating mild depression. Will continue to monitor.  Plan:  - Continue increased dose of venlafaxine 75 mg daily (usually taken at bedtime); dry mouth will likely improve with time but will follow up with symptoms/PHQ-9 at follow up visit in 1 month

## 2022-12-22 NOTE — Patient Instructions (Signed)
Thank you, Ms.Smriti Hills for allowing [place] to provide your care today. Today we discussed your blood pressure, depression, cholesterol levels, and ST/neurology follow-up.   I have ordered the following labs for you:  Lab Orders  No laboratory test(s) ordered today     Tests ordered today:  None today.   Referrals ordered today:   Referral Orders  No referral(s) requested today     I have ordered the following medication/changed the following medications:   Stop the following medications: Medications Discontinued During This Encounter  Medication Reason   atorvastatin (LIPITOR) 80 MG tablet      Start the following medications: Meds ordered this encounter  Medications   amlodipine-atorvastatin (CADUET) 5-80 MG tablet    Sig: Take 1 tablet by mouth daily.    Dispense:  30 tablet    Refill:  11     Follow up:   Please return in 4 weeks for a blood pressure check after starting your new medications.    Remember:   - Please STOP taking the atorvastatin 80 daily by itself. You will instead be taking that dose in your NEW combination tablet which is made up of amlodipine 5 mg daily for blood pressure control AND the atorvastatin 80 mg daily. A known side effect of amlodipine is swelling in the legs. Please call us if you notice that this happens so we can provide you with an alternative medication.   - Continue taking aspirin 81 mg daily.   - You will continue taking your 4 Plavix pills, one daily, until you are done on 8/09.   - You will go to your follow up appointment with Dr. Teresa Coombs (neurology) on 10/09 at 9:45 AM, they'd like for you to be there at 9:15 AM.   - You are fine to continue taking the Venlafaxine 37.5 mg twice a day. The side effects of dry mouth will improve with time. If they do not we can discuss a different medication or dose at your follow up visit.   - Please visit [email or web address] for great portion and meal ideas. Please try to limit your salt intake,  daily intake should not be greater than 2 g.   - We will see you in 4 weeks for a blood pressure and to determine if medications are still appropriate.   Should you have any questions or concerns please call the internal medicine clinic at [phone].     Holly Iannaccone Colbert Coyer, MD PGY-1 Internal Medicine Teaching Prog[facility]

## 2022-12-22 NOTE — Assessment & Plan Note (Addendum)
Patient with no prior history of hypertension. Permissive hypertension allowed during admission. Blood pressure on discharge 120/82 (12/08/22). Patient has been taking BP at home using a phone application. Has been intermittently hypertensive. Today she had an initial blood pressure of 140/87 and a repeat blood pressure of 149/84. Patient became upset discussing elevated blood pressures. Given extensive small vessel ischemic disease on imaging and recent history of CVA, will treat with an antihypertensive today. To help decrease patient's pill burden, will combine amlodipine 5 mg daily with atorvastatin 80 daily (CADUET) for one daily tablet. Discussed possible side effects of amlodipine. Patient to pick up medication today/tomorrow for a tomorrow start date.   Patient interested in making other changes, including walking regularly and improved diet. Patient was provided with informational handouts/resources. Will follow-up with patient in 1 month for BP check.   Plan:  - Patient will follow up with resources including [email or web address], DASH diet, and 6-week walking program from the [place] heart [employer].  - Patient will try to limit daily sodium intake (less than 2 g) - Follow up blood pressures at 1 month follow up  - STOP atorvastatin 80 mg pill and START combination pill (CADUET) with amlodipine 5 mg and atorvastatin 80 mg daily

## 2022-12-22 NOTE — Progress Notes (Signed)
New Patient Office Visit  Subjective    Patient ID: Martha Spencer, female    DOB: 08/11/1950  Age: 72 y.o. MRN: [id number]  CC:  Chief Complaint  Patient presents with   Hospitalization Follow-up   Depression    HPI Martha Spencer presents to establish care and for hospitalization follow-up. Martha Spencer is a 72 y.o. with a history of depression who was admitted to the ED on 7/20 for speech changes. Her only prescribed medication at the time was venlafaxine at bedtime and steroid topicals for eczema as needed. 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 Patient started speech therapy inpatient to help address slowed, slurred speech and difficulties with swallowing. Patient was discharged on venlafaxine 37.5 mg, Plavix 75 and aspirin 81 mg (to be taken together for 3 weeks, then aspirin 81 alone indefinitely), and swallowing precautions. She was referred to outpatient speech therapy for continued progress.   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 Initial symptoms of stroke also included right hand weakness which improved with time and inpatient OT sessions and recommendations.    Outpatient Encounter Medications as of 12/22/2022  Medication Sig   amlodipine-atorvastatin (CADUET) 5-80 MG tablet Take 1 tablet by mouth daily.   aspirin 81 MG chewable tablet Chew 1 tablet (81 mg total) by mouth daily.   clobetasol cream (TEMOVATE) 0.05 % APPLY SPARINGLY TO AFFECTED AREA TWICE A DAY   clopidogrel (PLAVIX) 75 MG tablet Take 1 tablet (75 mg total) by mouth daily.   venlafaxine XR (EFFEXOR-XR) 37.5 MG 24 hr capsule Take 2 tablets by mouth daily.   [DISCONTINUED] atorvastatin (LIPITOR) 80 MG tablet Take 1 tablet (80 mg total) by mouth daily.   [DISCONTINUED] food thickener (SIMPLYTHICK, NECTAR/LEVEL 2/MILDLY THICK,) GEL Add 1 packet to 4 ounces of  liquid and take by mouth as directed as needed   No facility-administered encounter medications on file as of 12/22/2022.    Past Medical History:  Diagnosis Date   Anxiety    Depression    GERD (gastroesophageal reflux disease)    occ    Past Surgical History:  Procedure Laterality Date   ADENOIDECTOMY     COLONOSCOPY  2009   Dr Virginia Rochester -poor prep    EYE SURGERY     OOPHORECTOMY     WISDOM TOOTH EXTRACTION      Family History  Problem Relation Age of Onset   Colon cancer Neg Hx    Colon polyps Neg Hx    Esophageal cancer Neg Hx    Rectal cancer Neg Hx    Stomach cancer Neg Hx     Social History   Socioeconomic History   Marital status: Divorced    Spouse name: Not on file   Number of children: Not on file   Years of education: Not on file   Highest education level: Not on file  Occupational History   Not on file  Tobacco Use   Smoking status: Never   Smokeless tobacco: Never  Vaping Use   Vaping status: Never Used  Substance and Sexual Activity   Alcohol use: No   Drug use: No   Sexual activity: Not on file  Other Topics Concern   Not on file  Social History Narrative   Not on file   Social Determinants of Health   Financial Resource Strain: Not on file  Food Insecurity: No Food Insecurity (12/07/2022)   Hunger Vital Sign    Worried About Running Out of Food in the Last Year: Never true    Ran Out of Food in the Last Year: Never true  Transportation Needs: No Transportation Needs (12/07/2022)   PRAPARE - Administrator, Civil Service (Medical): No    Lack of Transportation (Non-Medical): No  Physical Activity: Not on file  Stress: Not on file  Social Connections: Not on file  Intimate Partner Violence: Not At Risk (12/07/2022)   Humiliation, Afraid, Rape, and Kick questionnaire    Fear of Current or Ex-Partner: No    Emotionally Abused: No    Physically Abused: No    Sexually Abused: No    Review of Systems  Constitutional:  Negative  for chills and fever.  HENT:         Dry mouth  Eyes:  Negative for blurred vision.  Respiratory:  Negative for cough.   Cardiovascular:  Negative for chest pain and palpitations.  Gastrointestinal:  Negative for abdominal pain, diarrhea, nausea and vomiting.  Skin:  Negative for rash.     Objective    BP (!) 149/94 (BP Location: Left Arm, Patient Position: Sitting, Cuff Size: Normal)   Pulse 70   Temp 98 F (36.7 C) (Esophageal)   Ht 5\' 7"  (1.702 m)   Wt 153 lb 12.8 oz (69.8 kg)   SpO2 100%   BMI 24.09 kg/m   Physical Exam HENT:     Head: Normocephalic and atraumatic.     Mouth/Throat:     Mouth: Mucous membranes are dry.  Eyes:     Extraocular Movements: Extraocular movements intact.     Pupils: Pupils are equal, round, and reactive to light.  Cardiovascular:     Rate and Rhythm: Normal rate and regular rhythm.     Pulses: Normal pulses.     Heart sounds: No murmur heard.    No gallop.  Pulmonary:     Effort: Pulmonary effort is normal.     Breath sounds: Normal breath sounds.  Abdominal:     General: Bowel sounds are normal. There is no distension.     Palpations: Abdomen is soft.     Tenderness: There is no abdominal tenderness.  Musculoskeletal:        General: No swelling. Normal range of motion.  Skin:    General: Skin is warm and dry.     Findings: Bruising present.  Neurological:     General: No focal deficit present.     Mental Status: She is alert and oriented to person, place, and time.     Sensory: No sensory deficit.     Motor: No weakness.  Psychiatric:        Behavior: Behavior normal.     Comments: Patient became teary-eyed when discussing elevated blood pressures today.    Last hemoglobin A1c Lab Results  Component Value Date   HGBA1C 5.6 12/06/2022        Assessment & Plan:   Problem List Items Addressed This Visit       Cardiovascular and Mediastinum   CVA (cerebral vascular accident) [facility])    Patient has continued taking  aspirin 81 and plavix 75 daily. Per neurology, plan was to take for 3 weeks (7/20-8/09) together, then stop plavix and continue taking aspirin 81 daily indefinitely. Patient reports she has 4 plavix left (enough for once daily until end date 8/09). Has noticed increased bruising (especially on  arms) but reports no falls. Patient was also started on atorvastatin 80 daily during admission to help with risk stratification following stroke. Denies any side effects like muscle pain, nausea, bloating, or dizziness. Follow up appointment with Dr. Teresa Coombs (neurology) is scheduled for 10/09 at 9:45 AM.   Patient has made significant progress with speech therapy. Her speech today is closer to her baseline. Has 5 sessions left (has completed 4/9 total). Has been eating a regular diet and is able to swallow pills without any problem. No more choking episodes since admission. No other focal deficits on neuro exam today, patient alert and oriented to self, date, place, and situation. 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   Will repeat BMP at next visit as well as TSH for regular monitoring. TSH slightly elevated during admission, no history of hypothyroidism or hyperthyroidism.   Plan:  - Finish taking Plavix, expected end date 8/09 - Continue Aspirin 81 daily  - Continue Atorvastatin 80 daily, repeat BMP next visit - Continue going to speech therapy sessions  - Follow up with neurology       Relevant Medications   amlodipine-atorvastatin (CADUET) 5-80 MG tablet   Essential hypertension - Primary    Patient with no prior history of hypertension. Permissive hypertension allowed during admission. Blood pressure on discharge 120/82 (12/08/22). Patient has been taking BP at home using a phone application. Has been intermittently hypertensive. Today she had an initial blood pressure of 140/87 and a repeat blood  pressure of 149/84. Patient became upset discussing elevated blood pressures. Given extensive small vessel ischemic disease on imaging and recent history of CVA, will treat with an antihypertensive today. To help decrease patient's pill burden, will combine amlodipine 5 mg daily with atorvastatin 80 daily (CADUET) for one daily tablet. Discussed possible side effects of amlodipine. Patient to pick up medication today/tomorrow for a tomorrow start date.   Patient interested in making other changes, including walking regularly and improved diet. Patient was provided with informational handouts/resources. Will follow-up with patient in 1 month for BP check.   Plan:  - Patient will follow up with resources including [email or web address], DASH diet, and 6-week walking program from the [place] heart [employer].  - Patient will try to limit daily sodium intake (less than 2 g) - Follow up blood pressures at 1 month follow up  - STOP atorvastatin 80 mg pill and START combination pill (CADUET) with amlodipine 5 mg and atorvastatin 80 mg daily      Relevant Medications   amlodipine-atorvastatin (CADUET) 5-80 MG tablet     Other   Depression    Patient with history of depression. Has been taking venlafaxine daily for many years, with patient adjusting doses between 37.5 mg daily up to 150 mg daily. Patient reports she has been feeling more sad/upset by her stroke, especially some of the changes she has had to make in her life (reduced work schedule). She is encouraged by the progress she has made in speech therapy. Became teary eyed discussing having to take additional medications for her blood pressure. Per patient, she recently increased her dose of venlafaxine from 37.5 mg daily to 75 mg daily. Has been taking this dose for about a week and reports improved mood. Denies N/V, diarrhea, dizziness, or abdominal pain but does endorse dry mouth. Today, patient scored a 5 on the PHQ-9, indicating mild depression. Will  continue to monitor.  Plan:  -  Continue increased dose of venlafaxine 75 mg daily (usually taken at bedtime); dry mouth will likely improve with time but will follow up with symptoms/PHQ-9 at follow up visit in 1 month       Return in about 4 weeks (around 01/19/2023) for BP check, labs as needed.    Patient seen with Dr. Antony Contras.   Lonette Stevison Colbert Coyer, MD

## 2022-12-23 ENCOUNTER — Telehealth: Payer: Self-pay

## 2022-12-23 NOTE — Progress Notes (Signed)
Internal Medicine Clinic Attending  I was physically present during the key portions of the resident provided service and participated in the medical decision making of patient's management care. I reviewed pertinent patient test results.  The assessment, diagnosis, and plan were formulated together and I agree with the documentation in the resident's note.  Janki Dike, MD  

## 2022-12-23 NOTE — Telephone Encounter (Signed)
good morning ... you saw this pt yesterday and in the hospital  she is not happy that she was not told that we are a teaching program and she will not be seeing the same dr all the time  at each visit , I  tried to explaining  how our program works but she still was not happy  she tried to change the appt for when you will be back in clinic but after this months your schedule return date is in October  .. she stated she  will keep her appt with Dr patel but she will  more than likely will be changing after .Marland KitchenMarland Kitchen

## 2022-12-26 ENCOUNTER — Ambulatory Visit: Payer: Medicare Other | Admitting: Speech Pathology

## 2022-12-26 DIAGNOSIS — R471 Dysarthria and anarthria: Secondary | ICD-10-CM | POA: Diagnosis not present

## 2022-12-26 NOTE — Therapy (Signed)
OUTPATIENT SPEECH LANGUAGE PATHOLOGY TREATMENT   Patient Name: Martha Spencer MRN: [id number] DOB:September 13, 1950, 72 y.o., female Today's Date: 12/26/2022  PCP: Reymundo Poll, MD REFERRING PROVIDER: Reymundo Poll, MD   END OF SESSION:   End of Session - 12/26/22 0804     Visit Number 5    Number of Visits 9    Date for SLP Re-Evaluation 01/09/23    Authorization Type [employer]    SLP Start Time 0804    SLP Stop Time  0845    SLP Time Calculation (min) 41 min    Activity Tolerance Patient tolerated treatment well                Past Medical History:  Diagnosis Date   Anxiety    Depression    GERD (gastroesophageal reflux disease)    occ   Past Surgical History:  Procedure Laterality Date   ADENOIDECTOMY     COLONOSCOPY  2009   Dr Virginia Rochester -poor prep    EYE SURGERY     OOPHORECTOMY     WISDOM TOOTH EXTRACTION     Patient Active Problem List   Diagnosis Date Noted   Essential hypertension 12/22/2022   Depression 12/22/2022   CVA (cerebral vascular accident) (HCC) 12/06/2022   Chronic constipation 04/11/2013   Abdominal wall mass of right lower quadrant 04/05/2013    ONSET DATE: 12/08/22  REFERRING DIAG: I63.9 (ICD-10-CM) - Cerebrovascular accident (CVA), unspecified mechanism (HCC)  THERAPY DIAG: Dysarthria and anarthria  Rationale for Evaluation and Treatment: Rehabilitation  SUBJECTIVE:   SUBJECTIVE STATEMENT: "after I have been talking for a while, I get tired"   PERTINENT HISTORY: Patient lives alone and reports that she noticed speech/swallowing changes yesterday. Per patient, had one episode of choking on coffee the morning of 7/18. While watching TV, noticed she had difficulty repeating things accurately, voice sounded different. Woke on 7/21, drank a few sips of coffee with no further choking, but became concerned when voice seemed slower and different than usual and called EMS. Endorses slight headache now but thinks it may be due to caffeine  withdrawal since she limited her usual coffee intake to a few sips today. Patient denies falls, numbness, weakness, visual changes, or difficulties ambulating. Pt reports her voice has a lower speaking voice and moderate fatigue following more than a few minutes of speech. Pt reports no difficulty in thinking skills or word finding. The pt reports eating ice cream last night with no issue and has had a few sips of water since admission with no concerns.   PAIN:  Are you having pain? No  FALLS: Has patient fallen in last 6 months?  No  LIVING ENVIRONMENT: Lives with: lives with their family Lives in: House/apartment  PLOF:  Level of assistance: Independent with ADLs, Independent with IADLs Employment: Full-time employment  PATIENT GOALS: "To get back to normal as soon as I can."  OBJECTIVE:   INSTRUMENTAL SWALLOW STUDY FINDINGS (MBSS) 12/08/22 Clinical Impression: Pt notes difficulty swallowing since being in the hospital, noted by coughing after liquids and increased difficulty clearing solids. Reports no difficulty on Dys 1 diet with nectar thick liquids. Pt has suspected osteophytes that do cause noted pharyngeal narrowing, although this does not seem to affect her swallowing function. Today, pt's oropharyngeal function appears WFL. She effectively initiates and completes the swallow without any penetration/aspiration with any consistency adminstered. Recommend regular texture diet with thin liquids and meds given whole. No further f/u necessary for swallowing, although pt may benefit  from ongoing ST for dysarthria on an outpatient basis.  Objective swallow impairments: None noted.  Objective recommended compensations: Small sips, take time while eating.   PATIENT REPORTED OUTCOME MEASURES (PROM): Speech Handicap Index: 44 (good to average) with main complaints of vocal fatigue throughout the day and increased effort throughout the day.   TODAY'S TREATMENT:                                                                                                                                           12/26/22: Pt reports successful modification of speech rate to aid in improved clarity when fatigued. Ongoing challenges reported with multi-syllabic words. Pt successful in 5/5 trials at sentence generation level. In 30 minute conversation, pt 100% intelligible. Self-correction x2 for trisyllabic word. Continue to recommended frequent practice to aid endurance and carryover of strategies.  12/22/22: Reported continued improvements in speech clarity, which mildly reduces when fatigued. Continues with taking breaks and focusing on talking when most rested. Re-iterated importance of intentionally compensating when fatigued. Targeted generation of sentences of multi-syllabic words, in which pt required rare min A to over-articulate and slow rate. Intermittent self-corrections exhibited in conversation >25+ minutes. Pt remained 100% intelligible although mild fatigue indicated. Continue to recommended frequent practice to aid endurance and carryover of strategies. Reported return to regular diet, with exception of dry textures, with no issues endorsed.   12/19/2022: Pt with teach back of faciliative dysarthria strategies and compensations which A in successful return to work this past week. Was fatigued by third day of lessons, tells SLP it was not just speaking but also stress associated with being present and attentive to students. Is looking forward to fall when lessons will be more spaced out throughout the week. Reports self-disclosure was beneficial and no overt communication breakdowns occurred d/t dysarthria. Addressed employment of dysarthria strategies with focus on over-articulation and slow rate during production of tongue twisters. Pt with 90% accuracy across trials, demonstrating awareness of increased rate and ability to self-correct with rare min-A. Target carryover of skills during structured  conversation with pt again demonstrating awareness and aiblity to self-correct. 100% intelligible. Endorses continuation of eating softer solids. Some hypogeusia with savory or salty foods. Reviewed swallow compensations for liquids and solids, with pt verbalizing understanding via teach back.   12-15-22: Reviewing HEP from previous session, patient states that it went well. Demonstrated with more fluent speech during today's session when compared with initial evaluation a few days ago. Pt states that swallowing has gotten better, and that she is no longer avoiding water. 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 Targeted dysarthria through motor speech warmup, patient required occasional min-A of models and immediate feedback. Patient benefited from slow rate  to focus on over articulation. Patient led through oral reading exercises at paragraph level, with occasional verbal cues to slow down and focus on over articulation. Intelligibility increased when pt intentionally slowed rate. Pt demonstrated difficulty with /s/ sounds. HEP provided for oral reading for passages with frequent /s/ sounds and multi-syllable words. All questions answered to patient satisfaction.   12/12/22: SLP provided education on pt MBSS results and recommendations. Encouraged pt to drink water to meet hydration needs and progressing solids with use of energy conservation strategies and pacing to ensure full mastication in presence of fatigue. Pt verbalized understanding via teach back. Initiated education regarding use of dysarthria strategies using Clear Speech framework. Suggest pt focus on over-articulation vs vocal loudness in effort to modulate energy required to achieve more precise speech. Initiated HEP, see below.   HOME EXERCISE PROGRAM: Oral  motor exercises, oral reading with focus on over-articulation  PATIENT EDUCATION: Education details: results and recommendations Person educated: Patient Education method: Explanation, Demonstration, and Handouts Education comprehension: verbalized understanding and returned demonstration   GOALS: Goals reviewed with patient? Yes  SHORT TERM GOALS: Target date: 01/02/2023  Pt will verbalize x3 energy conservation strategies   Baseline: Goal status: IN PROGRESS  2.  Pt will demonstrate dysarthria strategies/compensations during structured language tasks with occasional min-A Baseline: 12/19/22, 12-22-22 Goal status: IN PROGRESS   LONG TERM GOALS: Target date: 01/09/2023   Pt will carryover fatigue management strategies resulting in decreased impact of fatigue on speech intelligibility throughout the day Baseline:  Goal status: IN PROGRESS  2.  Pt will demonstrate dysarthria strategies/compensations during 20 minute conversation with mod-I Baseline:  Goal status: IN PROGRESS  3.  Pt will rate speech at 9/10 via visual analog scale by d/c Baseline: 8/10 (1 being no speech, 10 being baseline)  Goal status: IN PROGRESS  ASSESSMENT:  CLINICAL IMPRESSION: Patient is a 72 y.o. 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 Evaluation reveals mild dysarthria. Pt's speech is c/b variable rate of speech, low vocal intensity, and imprecise articulation. Pt's deficits become more evident over utterance course and during expanded responses. Pt reports fatigue is main complaint. As her day progresses, she needs to put forth more effort to have clear speech and speech becomes more difficult to understand. Endorses some improvement since acute onset. Using visual analog scale, pt rates speech as 5/10 at acute onset and has improved to 8/10 today. Education provided on how ST can address energy conservation, and compensations that can be put in  place to better manage voice throughout the day, and to engage in self disclosure to facilitates effective communication with friends, colleagues, and students. Pt would benefit from skilled ST to address aforementioned deficits to enhance communication efficacy.   OBJECTIVE IMPAIRMENTS: include dysarthria. These impairments are limiting patient from return to work and effectively communicating at home and in community. Factors affecting potential to achieve goals and functional outcome are  none evidenced . Patient will benefit from skilled SLP services to address above impairments and improve overall function.  REHAB POTENTIAL: Good  PLAN:  SLP FREQUENCY: 1-2x/week  SLP DURATION: 4 weeks  PLANNED INTERVENTIONS: Environmental controls, Cueing hierachy, Oral motor exercises, Functional tasks, SLP instruction and feedback, and Compensatory strategies    Maia Breslow, CCC-SLP 12/26/2022, 8:04 AM

## 2022-12-29 ENCOUNTER — Ambulatory Visit: Payer: Medicare Other

## 2022-12-29 DIAGNOSIS — R471 Dysarthria and anarthria: Secondary | ICD-10-CM | POA: Diagnosis not present

## 2022-12-29 NOTE — Therapy (Signed)
OUTPATIENT SPEECH LANGUAGE PATHOLOGY TREATMENT   Patient Name: Martha Spencer MRN: [id number] DOB:May 21, 1950, 72 y.o., female Today's Date: 12/29/2022  PCP: Reymundo Poll, MD REFERRING PROVIDER: Reymundo Poll, MD   END OF SESSION:   End of Session - 12/29/22 0845     Visit Number 6    Number of Visits 9    Date for SLP Re-Evaluation 01/09/23    Authorization Type [employer]    SLP Start Time 0845    SLP Stop Time  0925    SLP Time Calculation (min) 40 min    Activity Tolerance Patient tolerated treatment well                 Past Medical History:  Diagnosis Date   Anxiety    Depression    GERD (gastroesophageal reflux disease)    occ   Past Surgical History:  Procedure Laterality Date   ADENOIDECTOMY     COLONOSCOPY  2009   Dr Virginia Rochester -poor prep    EYE SURGERY     OOPHORECTOMY     WISDOM TOOTH EXTRACTION     Patient Active Problem List   Diagnosis Date Noted   Essential hypertension 12/22/2022   Depression 12/22/2022   CVA (cerebral vascular accident) (HCC) 12/06/2022   Chronic constipation 04/11/2013   Abdominal wall mass of right lower quadrant 04/05/2013    ONSET DATE: 12/08/22  REFERRING DIAG: I63.9 (ICD-10-CM) - Cerebrovascular accident (CVA), unspecified mechanism (HCC)  THERAPY DIAG: Dysarthria and anarthria  Rationale for Evaluation and Treatment: Rehabilitation  SUBJECTIVE:   SUBJECTIVE STATEMENT: "what I notice is... it's more clear from the start"  PERTINENT HISTORY: Patient lives alone and reports that she noticed speech/swallowing changes yesterday. Per patient, had one episode of choking on coffee the morning of 7/18. While watching TV, noticed she had difficulty repeating things accurately, voice sounded different. Woke on 7/21, drank a few sips of coffee with no further choking, but became concerned when voice seemed slower and different than usual and called EMS. Endorses slight headache now but thinks it may be due to caffeine  withdrawal since she limited her usual coffee intake to a few sips today. Patient denies falls, numbness, weakness, visual changes, or difficulties ambulating. Pt reports her voice has a lower speaking voice and moderate fatigue following more than a few minutes of speech. Pt reports no difficulty in thinking skills or word finding. The pt reports eating ice cream last night with no issue and has had a few sips of water since admission with no concerns.   PAIN:  Are you having pain? No  FALLS: Has patient fallen in last 6 months?  No  LIVING ENVIRONMENT: Lives with: lives with their family Lives in: House/apartment  PLOF:  Level of assistance: Independent with ADLs, Independent with IADLs Employment: Full-time employment  PATIENT GOALS: "To get back to normal as soon as I can."  OBJECTIVE:   INSTRUMENTAL SWALLOW STUDY FINDINGS (MBSS) 12/08/22 Clinical Impression: Pt notes difficulty swallowing since being in the hospital, noted by coughing after liquids and increased difficulty clearing solids. Reports no difficulty on Dys 1 diet with nectar thick liquids. Pt has suspected osteophytes that do cause noted pharyngeal narrowing, although this does not seem to affect her swallowing function. Today, pt's oropharyngeal function appears WFL. She effectively initiates and completes the swallow without any penetration/aspiration with any consistency adminstered. Recommend regular texture diet with thin liquids and meds given whole. No further f/u necessary for swallowing, although pt may benefit from  ongoing ST for dysarthria on an outpatient basis.  Objective swallow impairments: None noted.  Objective recommended compensations: Small sips, take time while eating.   PATIENT REPORTED OUTCOME MEASURES (PROM): Speech Handicap Index: 44 (good to average) with main complaints of vocal fatigue throughout the day and increased effort throughout the day.   TODAY'S TREATMENT:                                                                                                                                           12/29/22: Reported awareness of particularly clear speech in the morning but overall improvements in speech clarity throughout day. Engaged in prompted conversation > 30+ minutes to address dysarthria at discourse level. Speech intelligibility rated 100% with mod I to carryover use of trained strategies. Rare words x2 with mild dysarthria but not unintelligible. Complex topic resulted in slower rate for thought maintenance.  12/26/22: Pt reports successful modification of speech rate to aid in improved clarity when fatigued. Ongoing challenges reported with multi-syllabic words. Pt successful in 5/5 trials at sentence generation level. In 30 minute conversation, pt 100% intelligible. Self-correction x2 for trisyllabic word. Continue to recommended frequent practice to aid endurance and carryover of strategies.  12/22/22: Reported continued improvements in speech clarity, which mildly reduces when fatigued. Continues with taking breaks and focusing on talking when most rested. Re-iterated importance of intentionally compensating when fatigued. Targeted generation of sentences of multi-syllabic words, in which pt required rare min A to over-articulate and slow rate. Intermittent self-corrections exhibited in conversation >25+ minutes. Pt remained 100% intelligible although mild fatigue indicated. Continue to recommended frequent practice to aid endurance and carryover of strategies. Reported return to regular diet, with exception of dry textures, with no issues endorsed.   12/19/2022: Pt with teach back of faciliative dysarthria strategies and compensations which A in successful return to work this past week. Was fatigued by third day of lessons, tells SLP it was not just speaking but also stress associated with being present and attentive to students. Is looking forward to fall when lessons will be more spaced  out throughout the week. Reports self-disclosure was beneficial and no overt communication breakdowns occurred d/t dysarthria. Addressed employment of dysarthria strategies with focus on over-articulation and slow rate during production of tongue twisters. Pt with 90% accuracy across trials, demonstrating awareness of increased rate and ability to self-correct with rare min-A. Target carryover of skills during structured conversation with pt again demonstrating awareness and aiblity to self-correct. 100% intelligible. Endorses continuation of eating softer solids. Some hypogeusia with savory or salty foods. Reviewed swallow compensations for liquids and solids, with pt verbalizing understanding via teach back.   12-15-22: Reviewing HEP from previous session, patient states that it went well. Demonstrated with more fluent speech during today's session when compared with initial evaluation a few days ago. Pt states that swallowing has gotten better, and that she  is no longer avoiding water. 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 Targeted dysarthria through motor speech warmup, patient required occasional min-A of models and immediate feedback. Patient benefited from slow rate to focus on over articulation. Patient led through oral reading exercises at paragraph level, with occasional verbal cues to slow down and focus on over articulation. Intelligibility increased when pt intentionally slowed rate. Pt demonstrated difficulty with /s/ sounds. HEP provided for oral reading for passages with frequent /s/ sounds and multi-syllable words. All questions answered to patient satisfaction.   12/12/22: SLP provided education on pt MBSS results and recommendations. Encouraged pt to drink water to meet hydration needs and progressing solids  with use of energy conservation strategies and pacing to ensure full mastication in presence of fatigue. Pt verbalized understanding via teach back. Initiated education regarding use of dysarthria strategies using Clear Speech framework. Suggest pt focus on over-articulation vs vocal loudness in effort to modulate energy required to achieve more precise speech. Initiated HEP, see below.   HOME EXERCISE PROGRAM: Oral motor exercises, oral reading with focus on over-articulation  PATIENT EDUCATION: Education details: results and recommendations Person educated: Patient Education method: Explanation, Demonstration, and Handouts Education comprehension: verbalized understanding and returned demonstration   GOALS: Goals reviewed with patient? Yes  SHORT TERM GOALS: Target date: 01/02/2023  Pt will verbalize x3 energy conservation strategies   Baseline: Goal status: IN PROGRESS  2.  Pt will demonstrate dysarthria strategies/compensations during structured language tasks with occasional min-A Baseline: 12/19/22, 12-22-22 Goal status: MET   LONG TERM GOALS: Target date: 01/09/2023   Pt will carryover fatigue management strategies resulting in decreased impact of fatigue on speech intelligibility throughout the day Baseline:  Goal status: IN PROGRESS  2.  Pt will demonstrate dysarthria strategies/compensations during 20 minute conversation with mod-I Baseline:  Goal status: IN PROGRESS  3.  Pt will rate speech at 9/10 via visual analog scale by d/c Baseline: 8/10 (1 being no speech, 10 being baseline)  Goal status: IN PROGRESS  ASSESSMENT:  CLINICAL IMPRESSION: Patient is a 72 y.o. 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 Evaluation reveals mild dysarthria. Pt's speech is c/b variable rate of speech, low vocal intensity, and imprecise articulation. Pt's deficits become more evident over utterance course and during expanded  responses. Pt reports fatigue is main complaint. As her day progresses, she needs to put forth more effort to have clear speech and speech becomes more difficult to understand. Endorses some improvement since acute onset. Using visual analog scale, pt rates speech as 5/10 at acute onset and has improved to 8/10 today. Education provided on how ST can address energy conservation, and compensations that can be put in place to better manage voice throughout the day, and to engage in self disclosure to facilitates effective communication with friends, colleagues, and students. Pt would benefit from skilled ST to address aforementioned deficits to enhance communication efficacy.   OBJECTIVE IMPAIRMENTS: include dysarthria. These impairments are limiting patient from return to work and effectively communicating at home and in community. Factors affecting potential to achieve goals and functional outcome are  none evidenced . Patient will benefit from skilled SLP services to address above impairments and improve overall function.  REHAB POTENTIAL: Good  PLAN:  SLP FREQUENCY: 1-2x/week  SLP DURATION: 4 weeks  PLANNED INTERVENTIONS: Environmental controls, Cueing hierachy, Oral motor exercises, Functional tasks, SLP instruction and feedback, and Compensatory strategies    Gracy Racer, CCC-SLP 12/29/2022, 8:45 AM

## 2023-01-02 ENCOUNTER — Ambulatory Visit: Payer: Medicare Other | Admitting: Speech Pathology

## 2023-01-02 DIAGNOSIS — R471 Dysarthria and anarthria: Secondary | ICD-10-CM | POA: Diagnosis not present

## 2023-01-02 NOTE — Therapy (Signed)
OUTPATIENT SPEECH LANGUAGE PATHOLOGY TREATMENT   Patient Name: Martha Spencer MRN: [id number] DOB:1950/12/07, 72 y.o., female Today's Date: 01/02/2023  PCP: Reymundo Poll, MD REFERRING PROVIDER: Reymundo Poll, MD   END OF SESSION:   End of Session - 01/02/23 0849     Visit Number 7    Number of Visits 9    Date for SLP Re-Evaluation 01/09/23    Authorization Type [employer]    SLP Start Time 0850    SLP Stop Time  0930    SLP Time Calculation (min) 40 min    Activity Tolerance Patient tolerated treatment well                 Past Medical History:  Diagnosis Date   Anxiety    Depression    GERD (gastroesophageal reflux disease)    occ   Past Surgical History:  Procedure Laterality Date   ADENOIDECTOMY     COLONOSCOPY  2009   Dr Virginia Rochester -poor prep    EYE SURGERY     OOPHORECTOMY     WISDOM TOOTH EXTRACTION     Patient Active Problem List   Diagnosis Date Noted   Essential hypertension 12/22/2022   Depression 12/22/2022   CVA (cerebral vascular accident) (HCC) 12/06/2022   Chronic constipation 04/11/2013   Abdominal wall mass of right lower quadrant 04/05/2013    ONSET DATE: 12/08/22  REFERRING DIAG: I63.9 (ICD-10-CM) - Cerebrovascular accident (CVA), unspecified mechanism (HCC)  THERAPY DIAG: Dysarthria and anarthria  Rationale for Evaluation and Treatment: Rehabilitation  SUBJECTIVE:   SUBJECTIVE STATEMENT: Pt reporting increased global fatigue which may be attributed to recently started BP medication.   PERTINENT HISTORY: Patient lives alone and reports that she noticed speech/swallowing changes yesterday. Per patient, had one episode of choking on coffee the morning of 7/18. While watching TV, noticed she had difficulty repeating things accurately, voice sounded different. Woke on 7/21, drank a few sips of coffee with no further choking, but became concerned when voice seemed slower and different than usual and called EMS. Endorses slight  headache now but thinks it may be due to caffeine withdrawal since she limited her usual coffee intake to a few sips today. Patient denies falls, numbness, weakness, visual changes, or difficulties ambulating. Pt reports her voice has a lower speaking voice and moderate fatigue following more than a few minutes of speech. Pt reports no difficulty in thinking skills or word finding. The pt reports eating ice cream last night with no issue and has had a few sips of water since admission with no concerns.   PAIN:  Are you having pain? No  FALLS: Has patient fallen in last 6 months?  No  LIVING ENVIRONMENT: Lives with: lives with their family Lives in: House/apartment  PLOF:  Level of assistance: Independent with ADLs, Independent with IADLs Employment: Full-time employment  PATIENT GOALS: "To get back to normal as soon as I can."  OBJECTIVE:   INSTRUMENTAL SWALLOW STUDY FINDINGS (MBSS) 12/08/22 Clinical Impression: Pt notes difficulty swallowing since being in the hospital, noted by coughing after liquids and increased difficulty clearing solids. Reports no difficulty on Dys 1 diet with nectar thick liquids. Pt has suspected osteophytes that do cause noted pharyngeal narrowing, although this does not seem to affect her swallowing function. Today, pt's oropharyngeal function appears WFL. She effectively initiates and completes the swallow without any penetration/aspiration with any consistency adminstered. Recommend regular texture diet with thin liquids and meds given whole. No further f/u necessary for swallowing,  although pt may benefit from ongoing ST for dysarthria on an outpatient basis.  Objective swallow impairments: None noted.  Objective recommended compensations: Small sips, take time while eating.   PATIENT REPORTED OUTCOME MEASURES (PROM): Speech Handicap Index: 44 (good to average) with main complaints of vocal fatigue throughout the day and increased effort throughout the day.    TODAY'S TREATMENT:                                                                                                                                          01/02/2023: Addressed strategies for fatigue management, as pt constructing schedule for upcoming semester. Pt is being mindful of scheduling day with fewer students per day and rest breaks in between to accommodate fatigue with speaking resulting in decreased clarity. Education provided re: brain health habits to support recovery s/p stroke. Provided pt with resources to aid in establishing habits regarding healthy brain habits, as pt having difficulty implementing exercise, though this has been goal. Recommend starting small, setting measurable goals, making specific plan. Pt verbalizes understanding. 100% intelligible throughout conversation, with x2 instances of syllable telescoping.   12/29/22: Reported awareness of particularly clear speech in the morning but overall improvements in speech clarity throughout day. Engaged in prompted conversation > 30+ minutes to address dysarthria at discourse level. Speech intelligibility rated 100% with mod I to carryover use of trained strategies. Rare words x2 with mild dysarthria but not unintelligible. Complex topic resulted in slower rate for thought maintenance.  12/26/22: Pt reports successful modification of speech rate to aid in improved clarity when fatigued. Ongoing challenges reported with multi-syllabic words. Pt successful in 5/5 trials at sentence generation level. In 30 minute conversation, pt 100% intelligible. Self-correction x2 for trisyllabic word. Continue to recommended frequent practice to aid endurance and carryover of strategies.  12/22/22: Reported continued improvements in speech clarity, which mildly reduces when fatigued. Continues with taking breaks and focusing on talking when most rested. Re-iterated importance of intentionally compensating when fatigued. Targeted generation of  sentences of multi-syllabic words, in which pt required rare min A to over-articulate and slow rate. Intermittent self-corrections exhibited in conversation >25+ minutes. Pt remained 100% intelligible although mild fatigue indicated. Continue to recommended frequent practice to aid endurance and carryover of strategies. Reported return to regular diet, with exception of dry textures, with no issues endorsed.   12/19/2022: Pt with teach back of faciliative dysarthria strategies and compensations which A in successful return to work this past week. Was fatigued by third day of lessons, tells SLP it was not just speaking but also stress associated with being present and attentive to students. Is looking forward to fall when lessons will be more spaced out throughout the week. Reports self-disclosure was beneficial and no overt communication breakdowns occurred d/t dysarthria. Addressed employment of dysarthria strategies with focus on over-articulation and slow rate during production of tongue twisters. Pt  with 90% accuracy across trials, demonstrating awareness of increased rate and ability to self-correct with rare min-A. Target carryover of skills during structured conversation with pt again demonstrating awareness and aiblity to self-correct. 100% intelligible. Endorses continuation of eating softer solids. Some hypogeusia with savory or salty foods. Reviewed swallow compensations for liquids and solids, with pt verbalizing understanding via teach back.   12-15-22: Reviewing HEP from previous session, patient states that it went well. Demonstrated with more fluent speech during today's session when compared with initial evaluation a few days ago. Pt states that swallowing has gotten better, and that she is no longer avoiding water. 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 Targeted dysarthria through motor speech warmup, patient required occasional min-A of models and immediate feedback. Patient benefited from slow rate to focus on over articulation. Patient led through oral reading exercises at paragraph level, with occasional verbal cues to slow down and focus on over articulation. Intelligibility increased when pt intentionally slowed rate. Pt demonstrated difficulty with /s/ sounds. HEP provided for oral reading for passages with frequent /s/ sounds and multi-syllable words. All questions answered to patient satisfaction.   12/12/22: SLP provided education on pt MBSS results and recommendations. Encouraged pt to drink water to meet hydration needs and progressing solids with use of energy conservation strategies and pacing to ensure full mastication in presence of fatigue. Pt verbalized understanding via teach back. Initiated education regarding use of dysarthria strategies using Clear Speech framework. Suggest pt focus on over-articulation vs vocal loudness in effort to modulate energy required to achieve more precise speech. Initiated HEP, see below.   HOME EXERCISE PROGRAM: Oral motor exercises, oral reading with focus on over-articulation  PATIENT EDUCATION: Education details: results and recommendations Person educated: Patient Education method: Explanation, Demonstration, and Handouts Education comprehension: verbalized understanding and returned demonstration   GOALS: Goals reviewed with patient? Yes  SHORT TERM GOALS: Target date: 01/02/2023  Pt will verbalize x3 energy conservation strategies   Baseline: Goal status: MET  2.  Pt will demonstrate dysarthria strategies/compensations during structured language tasks with occasional min-A Baseline: 12/19/22, 12-22-22 Goal status: MET   LONG TERM GOALS: Target date: 01/09/2023   Pt will carryover fatigue management strategies resulting in decreased impact of  fatigue on speech intelligibility throughout the day Baseline:  Goal status: IN PROGRESS  2.  Pt will demonstrate dysarthria strategies/compensations during 20 minute conversation with mod-I Baseline: 01/02/23 Goal status: MET  3.  Pt will rate speech at 9/10 via visual analog scale by d/c Baseline: 8/10 (1 being no speech, 10 being baseline)  Goal status: IN PROGRESS  ASSESSMENT:  CLINICAL IMPRESSION: Patient is a 72 y.o. 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 Evaluation reveals mild dysarthria. Pt's speech is c/b variable rate of speech, low vocal intensity, and imprecise articulation. Pt's deficits become more evident over utterance course and during expanded responses. Pt reports fatigue is main complaint. As her day progresses, she needs to put forth more effort to have clear speech and speech becomes more difficult to understand. Endorses some improvement since acute onset. Using visual analog scale, pt rates speech as 5/10 at acute onset and has improved to 8/10 today. Education provided on how ST can address energy conservation, and compensations that can be put  in place to better manage voice throughout the day, and to engage in self disclosure to facilitates effective communication with friends, colleagues, and students. Pt would benefit from skilled ST to address aforementioned deficits to enhance communication efficacy.   OBJECTIVE IMPAIRMENTS: include dysarthria. These impairments are limiting patient from return to work and effectively communicating at home and in community. Factors affecting potential to achieve goals and functional outcome are  none evidenced . Patient will benefit from skilled SLP services to address above impairments and improve overall function.  REHAB POTENTIAL: Good  PLAN:  SLP FREQUENCY: 1-2x/week  SLP DURATION: 4 weeks  PLANNED INTERVENTIONS: Environmental controls, Cueing hierachy, Oral  motor exercises, Functional tasks, SLP instruction and feedback, and Compensatory strategies    Maia Breslow, CCC-SLP 01/02/2023, 10:22 AM

## 2023-01-04 NOTE — Therapy (Unsigned)
OUTPATIENT SPEECH LANGUAGE PATHOLOGY TREATMENT   Patient Name: Martha Spencer MRN: [id number] DOB:02-Aug-1950, 72 y.o., female Today's Date: 01/05/2023  PCP: Reymundo Poll, MD REFERRING PROVIDER: Reymundo Poll, MD   END OF SESSION:   End of Session - 01/05/23 0847     Visit Number 8    Number of Visits 9    Date for SLP Re-Evaluation 01/09/23    Authorization Type [employer] Medicare    SLP Start Time 0847    SLP Stop Time  0933    SLP Time Calculation (min) 46 min    Activity Tolerance Patient tolerated treatment well                  Past Medical History:  Diagnosis Date   Anxiety    Depression    GERD (gastroesophageal reflux disease)    occ   Past Surgical History:  Procedure Laterality Date   ADENOIDECTOMY     COLONOSCOPY  2009   Dr Virginia Rochester -poor prep    EYE SURGERY     OOPHORECTOMY     WISDOM TOOTH EXTRACTION     Patient Active Problem List   Diagnosis Date Noted   Essential hypertension 12/22/2022   Depression 12/22/2022   CVA (cerebral vascular accident) (HCC) 12/06/2022   Chronic constipation 04/11/2013   Abdominal wall mass of right lower quadrant 04/05/2013    ONSET DATE: 12/08/22  REFERRING DIAG: I63.9 (ICD-10-CM) - Cerebrovascular accident (CVA), unspecified mechanism (HCC)  THERAPY DIAG: Dysarthria and anarthria  Rationale for Evaluation and Treatment: Rehabilitation  SUBJECTIVE:   SUBJECTIVE STATEMENT: "I feel tired all the time"   PERTINENT HISTORY: Patient lives alone and reports that she noticed speech/swallowing changes yesterday. Per patient, had one episode of choking on coffee the morning of 7/18. While watching TV, noticed she had difficulty repeating things accurately, voice sounded different. Woke on 7/21, drank a few sips of coffee with no further choking, but became concerned when voice seemed slower and different than usual and called EMS. Endorses slight headache now but thinks it may be due to caffeine withdrawal since she  limited her usual coffee intake to a few sips today. Patient denies falls, numbness, weakness, visual changes, or difficulties ambulating. Pt reports her voice has a lower speaking voice and moderate fatigue following more than a few minutes of speech. Pt reports no difficulty in thinking skills or word finding. The pt reports eating ice cream last night with no issue and has had a few sips of water since admission with no concerns.   PAIN:  Are you having pain? No  FALLS: Has patient fallen in last 6 months?  No  LIVING ENVIRONMENT: Lives with: lives with their family Lives in: House/apartment  PLOF:  Level of assistance: Independent with ADLs, Independent with IADLs Employment: Full-time employment  PATIENT GOALS: "To get back to normal as soon as I can."  OBJECTIVE:   INSTRUMENTAL SWALLOW STUDY FINDINGS (MBSS) 12/08/22 Clinical Impression: Pt notes difficulty swallowing since being in the hospital, noted by coughing after liquids and increased difficulty clearing solids. Reports no difficulty on Dys 1 diet with nectar thick liquids. Pt has suspected osteophytes that do cause noted pharyngeal narrowing, although this does not seem to affect her swallowing function. Today, pt's oropharyngeal function appears WFL. She effectively initiates and completes the swallow without any penetration/aspiration with any consistency adminstered. Recommend regular texture diet with thin liquids and meds given whole. No further f/u necessary for swallowing, although pt may benefit from ongoing ST  for dysarthria on an outpatient basis.  Objective swallow impairments: None noted.  Objective recommended compensations: Small sips, take time while eating.   PATIENT REPORTED OUTCOME MEASURES (PROM): Speech Handicap Index: 44 (good to average) with main complaints of vocal fatigue throughout the day and increased effort throughout the day.   TODAY'S TREATMENT:                                                                                                                                           01-05-23: Reported increased lethargy and fatigue over last two weeks which pt correlated to change in BP medication. While pt was 100% intelligible in extended conversation today, mild decreased rate and increased effort noted today. Re-assessed OME, which revealed mild reduced lingual coordination for L to R. Updated HEP to include some lingual focused exercises.   01/02/2023: Addressed strategies for fatigue management, as pt constructing schedule for upcoming semester. Pt is being mindful of scheduling day with fewer students per day and rest breaks in between to accommodate fatigue with speaking resulting in decreased clarity. Education provided re: brain health habits to support recovery s/p stroke. Provided pt with resources to aid in establishing habits regarding healthy brain habits, as pt having difficulty implementing exercise, though this has been goal. Recommend starting small, setting measurable goals, making specific plan. Pt verbalizes understanding. 100% intelligible throughout conversation, with x2 instances of syllable telescoping.   12/29/22: Reported awareness of particularly clear speech in the morning but overall improvements in speech clarity throughout day. Engaged in prompted conversation > 30+ minutes to address dysarthria at discourse level. Speech intelligibility rated 100% with mod I to carryover use of trained strategies. Rare words x2 with mild dysarthria but not unintelligible. Complex topic resulted in slower rate for thought maintenance.  12/26/22: Pt reports successful modification of speech rate to aid in improved clarity when fatigued. Ongoing challenges reported with multi-syllabic words. Pt successful in 5/5 trials at sentence generation level. In 30 minute conversation, pt 100% intelligible. Self-correction x2 for trisyllabic word. Continue to recommended frequent practice to aid endurance  and carryover of strategies.  12/22/22: Reported continued improvements in speech clarity, which mildly reduces when fatigued. Continues with taking breaks and focusing on talking when most rested. Re-iterated importance of intentionally compensating when fatigued. Targeted generation of sentences of multi-syllabic words, in which pt required rare min A to over-articulate and slow rate. Intermittent self-corrections exhibited in conversation >25+ minutes. Pt remained 100% intelligible although mild fatigue indicated. Continue to recommended frequent practice to aid endurance and carryover of strategies. Reported return to regular diet, with exception of dry textures, with no issues endorsed.   12/19/2022: Pt with teach back of faciliative dysarthria strategies and compensations which A in successful return to work this past week. Was fatigued by third day of lessons, tells SLP it was not just speaking but also stress associated with being  present and attentive to students. Is looking forward to fall when lessons will be more spaced out throughout the week. Reports self-disclosure was beneficial and no overt communication breakdowns occurred d/t dysarthria. Addressed employment of dysarthria strategies with focus on over-articulation and slow rate during production of tongue twisters. Pt with 90% accuracy across trials, demonstrating awareness of increased rate and ability to self-correct with rare min-A. Target carryover of skills during structured conversation with pt again demonstrating awareness and aiblity to self-correct. 100% intelligible. Endorses continuation of eating softer solids. Some hypogeusia with savory or salty foods. Reviewed swallow compensations for liquids and solids, with pt verbalizing understanding via teach back.   12-15-22: Reviewing HEP from previous session, patient states that it went well. Demonstrated with more fluent speech during today's session when compared with initial evaluation  a few days ago. Pt states that swallowing has gotten better, and that she is no longer avoiding water. 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 Targeted dysarthria through motor speech warmup, patient required occasional min-A of models and immediate feedback. Patient benefited from slow rate to focus on over articulation. Patient led through oral reading exercises at paragraph level, with occasional verbal cues to slow down and focus on over articulation. Intelligibility increased when pt intentionally slowed rate. Pt demonstrated difficulty with /s/ sounds. HEP provided for oral reading for passages with frequent /s/ sounds and multi-syllable words. All questions answered to patient satisfaction.   12/12/22: SLP provided education on pt MBSS results and recommendations. Encouraged pt to drink water to meet hydration needs and progressing solids with use of energy conservation strategies and pacing to ensure full mastication in presence of fatigue. Pt verbalized understanding via teach back. Initiated education regarding use of dysarthria strategies using Clear Speech framework. Suggest pt focus on over-articulation vs vocal loudness in effort to modulate energy required to achieve more precise speech. Initiated HEP, see below.   HOME EXERCISE PROGRAM: Oral motor exercises, oral reading with focus on over-articulation  PATIENT EDUCATION: Education details: results and recommendations Person educated: Patient Education method: Explanation, Demonstration, and Handouts Education comprehension: verbalized understanding and returned demonstration   GOALS: Goals reviewed with patient? Yes  SHORT TERM GOALS: Target date: 01/02/2023  Pt will verbalize x3 energy conservation strategies   Baseline: Goal status: MET  2.   Pt will demonstrate dysarthria strategies/compensations during structured language tasks with occasional min-A Baseline: 12/19/22, 12-22-22 Goal status: MET   LONG TERM GOALS: Target date: 01/09/2023   Pt will carryover fatigue management strategies resulting in decreased impact of fatigue on speech intelligibility throughout the day Baseline:  Goal status: IN PROGRESS  2.  Pt will demonstrate dysarthria strategies/compensations during 20 minute conversation with mod-I Baseline: 01/02/23 Goal status: MET  3.  Pt will rate speech at 9/10 via visual analog scale by d/c Baseline: 8/10 (1 being no speech, 10 being baseline)  Goal status: IN PROGRESS  ASSESSMENT:  CLINICAL IMPRESSION: Patient is a 72 y.o. 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 Endorses some improvement in speech clarity since acute onset; however, reported increased fatigue and lethargy over last 2 weeks which has resulted in return of mild dysarthria. Re-educated recommendations to aid speech clarity. Pt would benefit from skilled ST to address aforementioned deficits to enhance communication efficacy.  OBJECTIVE IMPAIRMENTS: include dysarthria. These impairments are limiting patient from return to work and effectively communicating at home and in community. Factors affecting potential to achieve goals and functional outcome are  none evidenced . Patient will benefit from skilled SLP services to address above impairments and improve overall function.  REHAB POTENTIAL: Good  PLAN:  SLP FREQUENCY: 1-2x/week  SLP DURATION: 4 weeks  PLANNED INTERVENTIONS: Environmental controls, Cueing hierachy, Oral motor exercises, Functional tasks, SLP instruction and feedback, and Compensatory strategies    Gracy Racer, CCC-SLP 01/05/2023, 9:35 AM

## 2023-01-05 ENCOUNTER — Ambulatory Visit: Payer: Medicare Other

## 2023-01-05 DIAGNOSIS — R471 Dysarthria and anarthria: Secondary | ICD-10-CM

## 2023-01-05 NOTE — Patient Instructions (Addendum)
LIP and TONGUE exercises  --- DO ALL EXERCISES TWICE EACH DAY  1. Lip press - Press your lips together firmly (like making a /m/ sound) and hold 5 seconds -repeat 10 times  2. LOUD and LONG Pryor Montes - make a kissing sound as loud as you can, as long as you can - repeat 10 times  3. Tongue sweep - Sweep your tongue in the pockets of your mouth - touch every tooth  - five revolutions each way  4. PUH! - 10 times       MAKE THE SOUNDS STRONG      TUH - 10 times      KUH! - 10 times  5. Move a licorice stick from side to side in your mouth - 10 back and forth movements  6. Say "OOOOO", and "EEEEE" 10 times (Kiss and smile)  7. Put air in your cheeks, and push on either side 10 times  *KEEP THE AIR IN and DON'T LET IT ESCAPE!!*  Lips  1. Press hard and briefly hold all the beginning sounds in these words: Repeat each set 2 times     "ma ma ma"    "boo boo boo"  "pa pa pa."          "moo moo moo" "bye bye bye"  "pie pie pie"          "me me me"  "bee bee bee"  "pea pea pea"   2. Pucker your lips tightly and say "OOOO," then smile wide and say "EEEE."  Repeat _2_ times.  3. Practice whistling for __30__ seconds.  4. "Blow out" candles.  Repeat _10___ times.  5. Blow kisses - make them LOUD!  Repeat __10__ times.   Tip of Tongue  1. Say the sound  "ta ta ta," "la la la,"          and "Research scientist (physical sciences)."  Repeat __2__ times.               "tee tee tee" "lee lee lee"   "dee dee dee"        "too too too" "loo loo loo"  "do do do"  2. Say "time," "took," "take," "town," and "Tom."  Repeat __2__ times.  3. Say "long," "look," "low," "lay" and "lie."  Repeat _2__ times.  4. Say "name," "neck," "not," "new," and "no."  Repeat __2_ times.  5. Say "down," "dip," "dot," "Don," and "do."  Repeat __2__ times   Back of Tongue  1. Say the sounds "ka ka ka" and "go go go."   Repeat ___2_ times.       "cow cow cow" "guy guy guy"       "koo koo koo" "goo goo goo"  2. Say "kick," "cake," "Jae Dire,"  "key," "keep," "kite," and "Ken."  Repeat __2__ times.  3. Say "gate," "gag," "got," "get," "gone," and "go."  Repeat __2__ times.

## 2023-01-09 ENCOUNTER — Ambulatory Visit: Payer: Medicare Other | Admitting: Speech Pathology

## 2023-01-12 ENCOUNTER — Encounter: Payer: Self-pay | Admitting: Student

## 2023-01-13 ENCOUNTER — Ambulatory Visit: Payer: Medicare Other | Admitting: Speech Pathology

## 2023-01-13 DIAGNOSIS — R471 Dysarthria and anarthria: Secondary | ICD-10-CM

## 2023-01-13 NOTE — Therapy (Signed)
OUTPATIENT SPEECH LANGUAGE PATHOLOGY TREATMENT   Patient Name: Martha Spencer MRN: [id number] DOB:1950/05/28, 72 y.o., female Today's Date: 01/13/2023  PCP: Reymundo Poll, MD REFERRING PROVIDER: Reymundo Poll, MD   END OF SESSION:   End of Session - 01/13/23 0846     Visit Number 9    Number of Visits 9    Date for SLP Re-Evaluation 01/09/23    Authorization Type [employer]    SLP Start Time 0846    SLP Stop Time  0930    SLP Time Calculation (min) 44 min    Activity Tolerance Patient tolerated treatment well              Past Medical History:  Diagnosis Date   Anxiety    Depression    GERD (gastroesophageal reflux disease)    occ   Past Surgical History:  Procedure Laterality Date   ADENOIDECTOMY     COLONOSCOPY  2009   Dr Virginia Rochester -poor prep    EYE SURGERY     OOPHORECTOMY     WISDOM TOOTH EXTRACTION     Patient Active Problem List   Diagnosis Date Noted   Essential hypertension 12/22/2022   Depression 12/22/2022   CVA (cerebral vascular accident) (HCC) 12/06/2022   Chronic constipation 04/11/2013   Abdominal wall mass of right lower quadrant 04/05/2013    ONSET DATE: 12/08/22  REFERRING DIAG: I63.9 (ICD-10-CM) - Cerebrovascular accident (CVA), unspecified mechanism (HCC)  THERAPY DIAG: Dysarthria and anarthria  Rationale for Evaluation and Treatment: Rehabilitation  SUBJECTIVE:   SUBJECTIVE STATEMENT: Reports speech was proficient through teaching yesterday   PERTINENT HISTORY: Patient lives alone and reports that she noticed speech/swallowing changes yesterday. Per patient, had one episode of choking on coffee the morning of 7/18. While watching TV, noticed she had difficulty repeating things accurately, voice sounded different. Woke on 7/21, drank a few sips of coffee with no further choking, but became concerned when voice seemed slower and different than usual and called EMS. Endorses slight headache now but thinks it may be due to caffeine  withdrawal since she limited her usual coffee intake to a few sips today. Patient denies falls, numbness, weakness, visual changes, or difficulties ambulating. Pt reports her voice has a lower speaking voice and moderate fatigue following more than a few minutes of speech. Pt reports no difficulty in thinking skills or word finding. The pt reports eating ice cream last night with no issue and has had a few sips of water since admission with no concerns.   PAIN:  Are you having pain? No  FALLS: Has patient fallen in last 6 months?  No  LIVING ENVIRONMENT: Lives with: lives with their family Lives in: House/apartment  PLOF:  Level of assistance: Independent with ADLs, Independent with IADLs Employment: Full-time employment  PATIENT GOALS: "To get back to normal as soon as I can."  OBJECTIVE:   INSTRUMENTAL SWALLOW STUDY FINDINGS (MBSS) 12/08/22 Clinical Impression: Pt notes difficulty swallowing since being in the hospital, noted by coughing after liquids and increased difficulty clearing solids. Reports no difficulty on Dys 1 diet with nectar thick liquids. Pt has suspected osteophytes that do cause noted pharyngeal narrowing, although this does not seem to affect her swallowing function. Today, pt's oropharyngeal function appears WFL. She effectively initiates and completes the swallow without any penetration/aspiration with any consistency adminstered. Recommend regular texture diet with thin liquids and meds given whole. No further f/u necessary for swallowing, although pt may benefit from ongoing ST for dysarthria on  an outpatient basis.  Objective swallow impairments: None noted.  Objective recommended compensations: Small sips, take time while eating.   PATIENT REPORTED OUTCOME MEASURES (PROM): Speech Handicap Index: 44 (good to average) with main complaints of vocal fatigue throughout the day and increased effort throughout the day.   TODAY'S TREATMENT:                                                                                                                                           01/13/23: reviewed dysarthria strategies to support communication across communication opportunities. Pt reports successful implementation of slowed rate when noticing increased fatigue impacting speech. Completed Speech Handicap Index, with pt reporting 12, indicating slight subjective improvement from baseline. Led pt through narrative discourse task, asked to rate speech clarity on 1-10 scale. Pt endorses speech as 9/10.   01-05-23: Reported increased lethargy and fatigue over last two weeks which pt correlated to change in BP medication. While pt was 100% intelligible in extended conversation today, mild decreased rate and increased effort noted today. Re-assessed OME, which revealed mild reduced lingual coordination for L to R. Updated HEP to include some lingual focused exercises.   01/02/2023: Addressed strategies for fatigue management, as pt constructing schedule for upcoming semester. Pt is being mindful of scheduling day with fewer students per day and rest breaks in between to accommodate fatigue with speaking resulting in decreased clarity. Education provided re: brain health habits to support recovery s/p stroke. Provided pt with resources to aid in establishing habits regarding healthy brain habits, as pt having difficulty implementing exercise, though this has been goal. Recommend starting small, setting measurable goals, making specific plan. Pt verbalizes understanding. 100% intelligible throughout conversation, with x2 instances of syllable telescoping.   12/29/22: Reported awareness of particularly clear speech in the morning but overall improvements in speech clarity throughout day. Engaged in prompted conversation > 30+ minutes to address dysarthria at discourse level. Speech intelligibility rated 100% with mod I to carryover use of trained strategies. Rare words x2 with mild dysarthria  but not unintelligible. Complex topic resulted in slower rate for thought maintenance.  12/26/22: Pt reports successful modification of speech rate to aid in improved clarity when fatigued. Ongoing challenges reported with multi-syllabic words. Pt successful in 5/5 trials at sentence generation level. In 30 minute conversation, pt 100% intelligible. Self-correction x2 for trisyllabic word. Continue to recommended frequent practice to aid endurance and carryover of strategies.  12/22/22: Reported continued improvements in speech clarity, which mildly reduces when fatigued. Continues with taking breaks and focusing on talking when most rested. Re-iterated importance of intentionally compensating when fatigued. Targeted generation of sentences of multi-syllabic words, in which pt required rare min A to over-articulate and slow rate. Intermittent self-corrections exhibited in conversation >25+ minutes. Pt remained 100% intelligible although mild fatigue indicated. Continue to recommended frequent practice to aid endurance and carryover of strategies. Reported return  to regular diet, with exception of dry textures, with no issues endorsed.   12/19/2022: Pt with teach back of faciliative dysarthria strategies and compensations which A in successful return to work this past week. Was fatigued by third day of lessons, tells SLP it was not just speaking but also stress associated with being present and attentive to students. Is looking forward to fall when lessons will be more spaced out throughout the week. Reports self-disclosure was beneficial and no overt communication breakdowns occurred d/t dysarthria. Addressed employment of dysarthria strategies with focus on over-articulation and slow rate during production of tongue twisters. Pt with 90% accuracy across trials, demonstrating awareness of increased rate and ability to self-correct with rare min-A. Target carryover of skills during structured conversation with pt  again demonstrating awareness and aiblity to self-correct. 100% intelligible. Endorses continuation of eating softer solids. Some hypogeusia with savory or salty foods. Reviewed swallow compensations for liquids and solids, with pt verbalizing understanding via teach back.   12-15-22: Reviewing HEP from previous session, patient states that it went well. Demonstrated with more fluent speech during today's session when compared with initial evaluation a few days ago. Pt states that swallowing has gotten better, and that she is no longer avoiding water. 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 Targeted dysarthria through motor speech warmup, patient required occasional min-A of models and immediate feedback. Patient benefited from slow rate to focus on over articulation. Patient led through oral reading exercises at paragraph level, with occasional verbal cues to slow down and focus on over articulation. Intelligibility increased when pt intentionally slowed rate. Pt demonstrated difficulty with /s/ sounds. HEP provided for oral reading for passages with frequent /s/ sounds and multi-syllable words. All questions answered to patient satisfaction.   12/12/22: SLP provided education on pt MBSS results and recommendations. Encouraged pt to drink water to meet hydration needs and progressing solids with use of energy conservation strategies and pacing to ensure full mastication in presence of fatigue. Pt verbalized understanding via teach back. Initiated education regarding use of dysarthria strategies using Clear Speech framework. Suggest pt focus on over-articulation vs vocal loudness in effort to modulate energy required to achieve more precise speech. Initiated HEP, see below.   HOME EXERCISE PROGRAM: Oral motor exercises, oral  reading with focus on over-articulation  PATIENT EDUCATION: Education details: results and recommendations Person educated: Patient Education method: Explanation, Demonstration, and Handouts Education comprehension: verbalized understanding and returned demonstration   GOALS: Goals reviewed with patient? Yes  SHORT TERM GOALS: Target date: 01/02/2023  Pt will verbalize x3 energy conservation strategies   Baseline: Goal status: MET  2.  Pt will demonstrate dysarthria strategies/compensations during structured language tasks with occasional min-A Baseline: 12/19/22, 12-22-22 Goal status: MET   LONG TERM GOALS: Target date: 01/09/2023   Pt will carryover fatigue management strategies resulting in decreased impact of fatigue on speech intelligibility throughout the day Baseline:  Goal status: MET  2.  Pt will demonstrate dysarthria strategies/compensations during 20 minute conversation with mod-I Baseline: 01/02/23 Goal status: MET  3.  Pt will rate speech at 9/10 via visual analog scale by d/c Baseline: 8/10 (1 being no speech, 10 being baseline)  Goal status: MET  ASSESSMENT:  CLINICAL IMPRESSION: Endorses some improvement in speech clarity since acute onset; however, reported increased fatigue and lethargy over  last 2 weeks which has resulted in return of mild dysarthria. Re-educated recommendations to aid speech clarity. Pt demonstrating firm understanding of targeted strategies. Agreeable to ST d/c, is pleased with current functional level though wishes for ongoing improvements.   OBJECTIVE IMPAIRMENTS: include dysarthria. These impairments are limiting patient from return to work and effectively communicating at home and in community. Factors affecting potential to achieve goals and functional outcome are  none evidenced . Patient will benefit from skilled SLP services to address above impairments and improve overall function.  REHAB POTENTIAL: Good  PLAN:  SLP FREQUENCY:  1-2x/week  SLP DURATION: 4 weeks  PLANNED INTERVENTIONS: Environmental controls, Cueing hierachy, Oral motor exercises, Functional tasks, SLP instruction and feedback, and Compensatory strategies  SPEECH THERAPY DISCHARGE SUMMARY  Visits from Start of Care: 9  Current functional level related to goals / functional outcomes: Mild dysarthria persists, primarily when fatigued. Pt endorses improved perception of speech quality. Is meeting communication demands socially and vocationally.    Remaining deficits: Mild dysarthria   Education / Equipment: Dysarthria strategies/compensations, fatigue management, HEP   Patient agrees to discharge. Patient goals were met. Patient is being discharged due to meeting the stated rehab goals.   Maia Breslow, CCC-SLP 01/13/2023, 8:49 AM

## 2023-01-20 ENCOUNTER — Other Ambulatory Visit: Payer: Self-pay

## 2023-01-20 ENCOUNTER — Ambulatory Visit (INDEPENDENT_AMBULATORY_CARE_PROVIDER_SITE_OTHER): Payer: Medicare Other | Admitting: Student

## 2023-01-20 ENCOUNTER — Ambulatory Visit: Payer: Medicare Other

## 2023-01-20 ENCOUNTER — Encounter: Payer: Self-pay | Admitting: Student

## 2023-01-20 VITALS — BP 115/71 | HR 74 | Temp 97.9°F | Ht 67.0 in | Wt 146.8 lb

## 2023-01-20 VITALS — BP 115/71 | HR 74 | Temp 97.9°F | Ht 67.0 in | Wt 146.5 lb

## 2023-01-20 DIAGNOSIS — F32A Depression, unspecified: Secondary | ICD-10-CM

## 2023-01-20 DIAGNOSIS — Z0000 Encounter for general adult medical examination without abnormal findings: Secondary | ICD-10-CM

## 2023-01-20 DIAGNOSIS — F3289 Other specified depressive episodes: Secondary | ICD-10-CM

## 2023-01-20 DIAGNOSIS — R748 Abnormal levels of other serum enzymes: Secondary | ICD-10-CM

## 2023-01-20 DIAGNOSIS — Z789 Other specified health status: Secondary | ICD-10-CM

## 2023-01-20 DIAGNOSIS — K719 Toxic liver disease, unspecified: Secondary | ICD-10-CM | POA: Insufficient documentation

## 2023-01-20 DIAGNOSIS — I10 Essential (primary) hypertension: Secondary | ICD-10-CM

## 2023-01-20 MED ORDER — AMLODIPINE BESYLATE 5 MG PO TABS: 5.0000 mg | ORAL_TABLET | Freq: Every day | ORAL | 3 refills | Status: DC

## 2023-01-20 MED ORDER — VENLAFAXINE HCL ER 75 MG PO CP24: 75.0000 mg | ORAL_CAPSULE | Freq: Every day | ORAL | 2 refills | Status: DC

## 2023-01-20 NOTE — Progress Notes (Signed)
Subjective:   Martha Spencer is a 72 y.o. female who presents for an Initial Medicare Annual Wellness Visit.  Visit Complete: In person  Patient Medicare AWV Spencer was completed by the patient on 01/20/2023; I have confirmed that all information answered by patient is correct and no changes since this date.  Review of Systems    Defer to PCP       Objective:    Today's Vitals   01/20/23 1058  BP: 115/71  Pulse: 74  Temp: 97.9 F (36.6 C)  TempSrc: Oral  SpO2: 98%  Weight: 146 lb 12.8 oz (66.6 kg)  Height: 5\' 7"  (1.702 m)  PainSc: 0-No pain   Body mass index is 22.99 kg/m.     01/20/2023   10:59 AM 01/20/2023    9:11 AM 12/22/2022    1:41 PM 12/12/2022    8:04 AM 12/07/2022    2:00 PM 12/06/2022   10:27 AM  Advanced Directives  Does Patient Have a Medical Advance Directive? No No No No No No  Would patient like information on creating a medical advance directive? No - Patient declined No - Patient declined Yes (ED - Information included in AVS) No - Patient declined No - Patient declined     Current Medications (verified) Outpatient Encounter Medications as of 01/20/2023  Medication Sig   amLODipine (NORVASC) 5 MG tablet Take 1 tablet (5 mg total) by mouth daily.   aspirin 81 MG chewable tablet Chew 1 tablet (81 mg total) by mouth daily.   clobetasol cream (TEMOVATE) 0.05 % APPLY SPARINGLY TO AFFECTED AREA TWICE A DAY   clopidogrel (PLAVIX) 75 MG tablet Take 1 tablet (75 mg total) by mouth daily.   venlafaxine XR (EFFEXOR-XR) 75 MG 24 hr capsule Take 1 capsule (75 mg total) by mouth daily.   No facility-administered encounter medications on file as of 01/20/2023.    Allergies (verified) Sulfa antibiotics and Wheat   History: Past Medical History:  Diagnosis Date   Anxiety    Depression    GERD (gastroesophageal reflux disease)    occ   Past Surgical History:  Procedure Laterality Date   ADENOIDECTOMY     COLONOSCOPY  2009   Dr Virginia Rochester -poor prep    EYE  SURGERY     OOPHORECTOMY     WISDOM TOOTH EXTRACTION     Family History  Problem Relation Age of Onset   Colon cancer Neg Hx    Colon polyps Neg Hx    Esophageal cancer Neg Hx    Rectal cancer Neg Hx    Stomach cancer Neg Hx    Social History   Socioeconomic History   Marital status: Divorced    Spouse name: Not on file   Number of children: Not on file   Years of education: Not on file   Highest education level: Not on file  Occupational History   Not on file  Tobacco Use   Smoking status: Never   Smokeless tobacco: Never  Vaping Use   Vaping status: Never Used  Substance and Sexual Activity   Alcohol use: No   Drug use: No   Sexual activity: Not on file  Other Topics Concern   Not on file  Social History Narrative   Not on file   Social Determinants of Health   Financial Resource Strain: Low Risk  (01/20/2023)   Overall Financial Resource Strain (CARDIA)    Difficulty of Paying Living Expenses: Not hard at all  Food Insecurity:  No Food Insecurity (01/20/2023)   Hunger Vital Sign    Worried About Running Out of Food in the Last Year: Never true    Ran Out of Food in the Last Year: Never true  Transportation Needs: No Transportation Needs (01/20/2023)   PRAPARE - Administrator, Civil Service (Medical): No    Lack of Transportation (Non-Medical): No  Physical Activity: Insufficiently Active (01/20/2023)   Exercise Vital Sign    Days of Exercise per Week: 1 day    Minutes of Exercise per Session: 20 min  Stress: No Stress Concern Present (01/20/2023)   Martha Spencer    Feeling of Stress : Only a little  Social Connections: Moderately Isolated (01/20/2023)   Social Connection and Isolation Panel [NHANES]    Frequency of Communication with Friends and Family: More than three times a week    Frequency of Social Gatherings with Friends and Family: Three times a week    Attends Religious Services: 1 to  4 times per year    Active Member of Clubs or Organizations: No    Attends Engineer, structural: Never    Marital Status: Divorced    Tobacco Counseling Counseling given: Not Answered   Clinical Intake:  Pre-visit preparation completed: Yes  Pain : No/denies pain Pain Score: 0-No pain     Nutritional Risks: None Diabetes: No  How often do you need to have someone help you when you read instructions, pamphlets, or other written materials from your doctor or pharmacy?: 1 - Never What is the last grade level you completed in school?: masters  Interpreter Needed?: No  Information entered by :: Martha Spencer,cma   Activities of Daily Living    01/20/2023   10:59 AM 01/20/2023    9:12 AM  In your present state of health, do you have any difficulty performing the following activities:  Hearing? 0 0  Vision? 0 0  Difficulty concentrating or making decisions? 0 0  Walking or climbing stairs? 0 0  Dressing or bathing? 0 0  Doing errands, shopping? 0 0    Patient Care Team: Martha Spencer, Martha Locket, MD as PCP - General (Internal Medicine)  Indicate any recent Medical Services you may have received from other than [facility] providers in the past year (date may be approximate).     Assessment:   This is a routine wellness examination for Martha Spencer.  Hearing/Vision screen No results found.  Dietary issues and exercise activities discussed:     Goals Addressed   None   Depression Screen    01/20/2023   10:59 AM 01/20/2023    9:49 AM 12/22/2022    8:57 PM  PHQ 2/9 Scores  PHQ - 2 Score 1 1 2   PHQ- 9 Score 7 7 5     Fall Risk    01/20/2023   10:59 AM 12/22/2022    1:40 PM  Fall Risk   Falls in the past year? 0 0  Number falls in past yr: 0 0  Injury with Fall? 0 0  Risk for fall due to : No Fall Risks No Fall Risks  Follow up Falls evaluation completed;Falls prevention discussed Falls evaluation completed;Falls prevention discussed    MEDICARE RISK AT  HOME: Medicare Risk at Home Any stairs in or around the home?: Yes If so, are there any without handrails?: No Home free of loose throw rugs in walkways, pet beds, electrical cords, etc?: Yes Adequate lighting in your  home to reduce risk of falls?: Yes Life alert?: No Use of a cane, walker or w/c?: No Grab bars in the bathroom?: No Shower chair or bench in shower?: No Elevated toilet seat or a handicapped toilet?: No  TIMED UP AND GO:  Was the test performed? No    Cognitive Function:        01/20/2023   11:00 AM  6CIT Screen  What Year? 0 points  What month? 0 points  What time? 0 points  Count back from 20 0 points  Months in reverse 0 points  Repeat phrase 0 points  Total Score 0 points    Immunizations Immunization History  Administered Date(s) Administered   PFIZER(Purple Top)SARS-COV-2 Vaccination 06/25/2019, 07/20/2019, 02/26/2020    TDAP status: Due, Education has been provided regarding the importance of this vaccine. Advised may receive this vaccine at local pharmacy or Health Dept. Aware to provide a copy of the vaccination record if obtained from local pharmacy or Health Dept. Verbalized acceptance and understanding.  Flu Vaccine status: Due, Education has been provided regarding the importance of this vaccine. Advised may receive this vaccine at local pharmacy or Health Dept. Aware to provide a copy of the vaccination record if obtained from local pharmacy or Health Dept. Verbalized acceptance and understanding.  Pneumococcal vaccine status: Due, Education has been provided regarding the importance of this vaccine. Advised may receive this vaccine at local pharmacy or Health Dept. Aware to provide a copy of the vaccination record if obtained from local pharmacy or Health Dept. Verbalized acceptance and understanding.  Covid-19 vaccine status: Completed vaccines  Qualifies for Shingles Vaccine? No   Zostavax completed No   Shingrix Completed?: No.     Education has been provided regarding the importance of this vaccine. Patient has been advised to call insurance company to determine out of pocket expense if they have not yet received this vaccine. Advised may also receive vaccine at local pharmacy or Health Dept. Verbalized acceptance and understanding.  Screening Tests Health Maintenance  Topic Date Due   Hepatitis C Screening  Never done   DTaP/Tdap/Td (1 - Tdap) Never done   Zoster Vaccines- Shingrix (1 of 2) Never done   MAMMOGRAM  11/17/2014   Pneumonia Vaccine 9+ Years old (1 of 1 - PCV) Never done   DEXA SCAN  Never done   INFLUENZA VACCINE  Never done   COVID-19 Vaccine (4 - 2023-24 season) 01/18/2023   Medicare Annual Wellness (AWV)  01/20/2024   Colonoscopy  03/15/2028   HPV VACCINES  Aged Out    Health Maintenance  Health Maintenance Due  Topic Date Due   Hepatitis C Screening  Never done   DTaP/Tdap/Td (1 - Tdap) Never done   Zoster Vaccines- Shingrix (1 of 2) Never done   MAMMOGRAM  11/17/2014   Pneumonia Vaccine 52+ Years old (1 of 1 - PCV) Never done   DEXA SCAN  Never done   INFLUENZA VACCINE  Never done   COVID-19 Vaccine (4 - 2023-24 season) 01/18/2023    Colorectal cancer screening: Type of screening: Colonoscopy. Completed 03/15/2018. Repeat every 10 years  Lung Cancer Screening: (Low Dose CT Chest recommended if Age 57-80 years, 20 pack-year currently smoking OR have quit w/in 15years.) does not qualify.   Lung Cancer Screening Referral: N/A  Additional Screening:  Hepatitis C Screening: does not qualify; Completed N/A  Vision Screening: Recommended annual ophthalmology exams for early detection of glaucoma and other disorders of the eye. Is the patient  up to date with their annual eye exam?  Yes  Who is the provider or what is the name of the office in which the patient attends annual eye exams? [facility] If pt is not established with a provider, would they like to be referred to a  provider to establish care? No .   Dental Screening: Recommended annual dental exams for proper oral hygiene  Community Resource Referral / Chronic Care Management: CRR required this visit?  No   CCM required this visit?  No     Plan:     I have personally reviewed and noted the following in the patient's chart:   Medical and social history Use of alcohol, tobacco or illicit drugs  Current medications and supplements including opioid prescriptions. Patient is not currently taking opioid prescriptions. Functional ability and status Nutritional status Physical activity Advanced directives List of other physicians Hospitalizations, surgeries, and ER visits in previous 12 months Vitals Screenings to include cognitive, depression, and falls Referrals and appointments  In addition, I have reviewed and discussed with patient certain preventive protocols, quality metrics, and best practice recommendations. A written personalized care plan for preventive services as well as general preventive health recommendations were provided to patient.     Cala Bradford, CMA   01/20/2023   After Visit Summary: (MyChart) Due to this being a telephonic visit, the after visit summary with patients personalized plan was offered to patient via MyChart   Nurse Notes: Face-To-Face Visit  Ms. Copley , Thank you for taking time to come for your Medicare Wellness Visit. I appreciate your ongoing commitment to your health goals. Please review the following plan we discussed and let me know if I can assist you in the future.   These are the goals we discussed:  Goals   None     This is a list of the screening recommended for you and due dates:  Health Maintenance  Topic Date Due   Hepatitis C Screening  Never done   DTaP/Tdap/Td vaccine (1 - Tdap) Never done   Zoster (Shingles) Vaccine (1 of 2) Never done   Mammogram  11/17/2014   Pneumonia Vaccine (1 of 1 - PCV) Never done   DEXA scan (bone  density measurement)  Never done   Flu Shot  Never done   COVID-19 Vaccine (4 - 2023-24 season) 01/18/2023   Medicare Annual Wellness Visit  01/20/2024   Colon Cancer Screening  03/15/2028   HPV Vaccine  Aged Out

## 2023-01-20 NOTE — Patient Instructions (Signed)
Martha Spencer, [place] you for allowing me to take part in your care today.  Here are your instructions.  1. Regarding your high blood pressure, it measured well today. I want you to stop the combo pill and just take the amlodipine 5 mg daily.   2. Regarding your high cholesterol stop taking the combo pill. I am checking labs today including the muscle breakdown enzyme called CK, and your liver and your kidney function. I will call you with the results and then we can discuss the options moving forward. It can be either referring you to a lipid clinic or starting rosuvastatin.   3. Please come back in 1 month and at that time we can see how your fatigue is doing.   Thank you, Dr. Allena Katz  If you have any other questions please contact the internal medicine clinic at [phone]

## 2023-01-20 NOTE — Assessment & Plan Note (Addendum)
>>  ASSESSMENT AND PLAN FOR STATIN INTOLERANCE WRITTEN ON 05/06/2023  7:11 AM BY TAWKALIYAR, ROYA, DO   >>ASSESSMENT AND PLAN FOR STATIN INTOLERANCE WRITTEN ON 01/21/2023  6:54 AM BY Walaa Carel, DO  Patient presents to the clinic today with concerns of fatigue after starting Caduet at previous visit.  She states that she just does not have much energy.  She states she still has interest in doing all of her activities, but just does not have the energy to do these anymore.  She states that she likes to go outside to walk, and now is just feeling tired.  She denies any muscle aches or muscle pains.  She does feel weaker.  She notes that she has had darker urine as well.  She denies any bleeding.  She does report in the past, that her brother had rhabdomyolysis.  On exam, patient is not jaundiced.  No scleral icterus noted.  Patient has 5/5 strength to upper and lower extremities.  Current differential includes statin induced myopathy versus statin intolerance.  CMP does show elevated liver enzymes, concerning for statin induced liver injury.  Will plan to stop statin at this time.  Will follow-up CK levels.  Plan: -Stop amlodipine-atorvastatin combination medication -Start amlodipine 5 mg daily -Follow-up CMP as this CMP today showed elevated liver enzymes -Follow-up CK level -Likely will need to refer patient to lipid clinic   >>ASSESSMENT AND PLAN FOR ELEVATED LIVER ENZYMES WRITTEN ON 01/21/2023  6:56 AM BY Carianna Lague, DO  Likely related to statin use. AST 243, up from 21 1 month ago, and ALT 207 up from 21 1 month ago.  Likely related to statin use.  Will have patient stop statin at this time.  Plan: -Stop atorvastatin 80 mg daily   >>ASSESSMENT AND PLAN FOR ELEVATED ALKALINE PHOSPHATASE LEVEL WRITTEN ON 01/22/2023  9:37 PM BY Gelisa Tieken, DO  Addendum:  Significantly elevated alkaline phosphatase levels. Will start work up for this. Could be related to the statin that she has been taking. Will  also broaden differential at this time to look for bone etiology, biliary etiology, infiltrative liver process, infection, autoimmune process,and thyroid etiology. Plan to obtain labs and obtain right upper quadrant ultrasound.    09/05 addendum: GGT elevated significantly. Urgent referral for GI and RUQ [place] placed. Autoimmune work up unremarkable. No related to increased iron levels. Not related to acute infectious hepatitis. Unclear etiology at this time. Will follow up results for RUQ.

## 2023-01-20 NOTE — Progress Notes (Signed)
CC: Follow-up hypertension  HPI:  [name] is a 72 y.o. female with a past medical history of hypertension, prior CVA, depression who presents to follow-up on labs. Please see assessment and plan for full HPI.   Medications: Hypertension: Amlodipine 5 mg daily CVA: Aspirin 81 mg Depression: Effexor 37.5 mg 2 tablet daily   Past Medical History:  Diagnosis Date   Anxiety    Depression    GERD (gastroesophageal reflux disease)    occ     Current Outpatient Medications:    amLODipine (NORVASC) 5 MG tablet, Take 1 tablet (5 mg total) by mouth daily., Disp: 90 tablet, Rfl: 3   aspirin 81 MG chewable tablet, Chew 1 tablet (81 mg total) by mouth daily., Disp: 90 tablet, Rfl: 0   clobetasol cream (TEMOVATE) 0.05 %, APPLY SPARINGLY TO AFFECTED AREA TWICE A DAY, Disp: , Rfl: 0   clopidogrel (PLAVIX) 75 MG tablet, Take 1 tablet (75 mg total) by mouth daily., Disp: 18 tablet, Rfl: 0   venlafaxine XR (EFFEXOR-XR) 75 MG 24 hr capsule, Take 1 capsule (75 mg total) by mouth daily., Disp: 30 capsule, Rfl: 2  Review of Systems:   Constitutional: Patient endorses fatigue   Physical Exam:  Vitals:   01/20/23 0909  BP: 115/71  Pulse: 74  Temp: 97.9 F (36.6 C)  TempSrc: Oral  SpO2: 98%  Weight: 146 lb 8 oz (66.5 kg)  Height: 5\' 7"  (1.702 m)   General: Patient is sitting comfortably in the room  Head: Normocephalic, atraumatic  Cardio: Regular rate and rhythm, no murmurs, rubs or gallops Pulmonary: Clear to ausculation bilaterally with no rales, rhonchi, and crackles  MSK: 5/5 strength noted to bilaterally upper and lower extremities    Assessment & Plan:   Essential hypertension Patient presents to the clinic with concerns of weakness and fatigue. She reports that this started after she was started on her new medication Caduet at the previous visit. Please see problem statin intolerance for more HPI. BP today is 115/71. Given concern for statin intolerance, will discontinue  the Caduet today and will prescribe amlodipine 5 mg daily. Patient BP at goal with the amlodipine 5 mg daily.  Plan: -Stop Caduet -Start amlodipine 5 mg daily   Statin intolerance Patient presents to the clinic today with concerns of fatigue after starting Caduet at previous visit.  She states that she just does not have much energy.  She states she still has interest in doing all of her activities, but just does not have the energy to do these anymore.  She states that she likes to go outside to walk, and now is just feeling tired.  She denies any muscle aches or muscle pains.  She does feel weaker.  She notes that she has had darker urine as well.  She denies any bleeding.  She does report in the past, that her brother had rhabdomyolysis.  On exam, patient is not jaundiced.  No scleral icterus noted.  Patient has 5/5 strength to upper and lower extremities.  Current differential includes statin induced myopathy versus statin intolerance.  CMP does show elevated liver enzymes, concerning for statin induced liver injury.  Will plan to stop statin at this time.  Will follow-up CK levels.  Plan: -Stop amlodipine-atorvastatin combination medication -Start amlodipine 5 mg daily -Follow-up CMP as this CMP today showed elevated liver enzymes -Follow-up CK level -Likely will need to refer patient to lipid clinic  Depression At last visit, patient was stating that she  was taking increased venlafaxine at 75 mg daily.  She states her mood is improved.  She has no concerns about this today.  Plan: -Continue venlafaxine 75 mg daily  Elevated liver enzymes Likely related to statin use. AST 243, up from 21 1 month ago, and ALT 207 up from 21 1 month ago.  Likely related to statin use.  Will have patient stop statin at this time.  Plan: -Stop atorvastatin 80 mg daily  Patient discussed with Dr. Mercie Eon  Modena Slater, DO PGY-2 Internal Medicine Resident  Pager: [phone]

## 2023-01-20 NOTE — Assessment & Plan Note (Signed)
Patient presents to the clinic with concerns of weakness and fatigue. She reports that this started after she was started on her new medication Caduet at the previous visit. Please see problem statin intolerance for more HPI. BP today is 115/71. Given concern for statin intolerance, will discontinue the Caduet today and will prescribe amlodipine 5 mg daily. Patient BP at goal with the amlodipine 5 mg daily.  Plan: -Stop Caduet -Start amlodipine 5 mg daily

## 2023-01-21 ENCOUNTER — Other Ambulatory Visit: Payer: Medicare Other

## 2023-01-21 ENCOUNTER — Other Ambulatory Visit: Payer: Self-pay | Admitting: Student

## 2023-01-21 ENCOUNTER — Encounter: Payer: Self-pay | Admitting: Student

## 2023-01-21 DIAGNOSIS — R748 Abnormal levels of other serum enzymes: Secondary | ICD-10-CM

## 2023-01-21 LAB — CMP14 + ANION GAP
ALT: 207 IU/L — ABNORMAL HIGH (ref 0–32)
AST: 243 IU/L — ABNORMAL HIGH (ref 0–40)
Albumin: 4.5 g/dL (ref 3.9–4.9)
Alkaline Phosphatase: 2093 IU/L (ref 44–121)
Anion Gap: 15 mmol/L (ref 10.0–18.0)
BUN/Creatinine Ratio: 12 (ref 12–28)
BUN: 10 mg/dL (ref 8–27)
Bilirubin Total: 0.8 mg/dL (ref 0.0–1.2)
CO2: 25 mmol/L (ref 20–29)
Calcium: 9.6 mg/dL (ref 8.7–10.3)
Chloride: 101 mmol/L (ref 96–106)
Creatinine, Ser: 0.82 mg/dL (ref 0.57–1.00)
Globulin, Total: 2.7 g/dL (ref 1.5–4.5)
Glucose: 86 mg/dL (ref 70–99)
Potassium: 4.7 mmol/L (ref 3.5–5.2)
Sodium: 141 mmol/L (ref 134–144)
Total Protein: 7.2 g/dL (ref 6.0–8.5)
eGFR: 77 mL/min/{1.73_m2} (ref >59)

## 2023-01-21 LAB — CK: Total CK: 100 U/L (ref 32–182)

## 2023-01-21 NOTE — Assessment & Plan Note (Signed)
Likely related to statin use. AST 243, up from 21 1 month ago, and ALT 207 up from 21 1 month ago.  Likely related to statin use.  Will have patient stop statin at this time.  Plan: -Stop atorvastatin 80 mg daily

## 2023-01-21 NOTE — Assessment & Plan Note (Signed)
Addendum:  Significantly elevated alkaline phosphatase levels. Will start work up for this. Could be related to the statin that she has been taking. Will also broaden differential at this time to look for bone etiology, biliary etiology, infiltrative liver process, infection, autoimmune process,and thyroid etiology. Plan to obtain labs and obtain right upper quadrant ultrasound.    09/05 addendum: GGT elevated significantly. Urgent referral for GI and RUQ [place] placed. Autoimmune work up unremarkable. No related to increased iron levels. Not related to acute infectious hepatitis. Unclear etiology at this time. Will follow up results for RUQ.

## 2023-01-21 NOTE — Progress Notes (Signed)
Call the patient regarding her lab work.  Patient has had elevated liver enzymes.  Patient had normal liver enzymes prior when checked a month ago.  Most recent results showing elevated AST and ALT greater than 200.  Patient also had significantly elevated alkaline phosphatase greater than 2000.  Called the patient to let her know we need to do more labs to evaluate for other etiologies.  This can include bone etiology, biliary etiology, infiltrative liver disease, infection, or extrahepatic disease such as thyroid disorder or HIV related.  Will obtain right upper quadrant ultrasound as well.

## 2023-01-21 NOTE — Assessment & Plan Note (Signed)
At last visit, patient was stating that she was taking increased venlafaxine at 75 mg daily.  She states her mood is improved.  She has no concerns about this today.  Plan: -Continue venlafaxine 75 mg daily

## 2023-01-22 ENCOUNTER — Other Ambulatory Visit: Payer: Self-pay | Admitting: Student

## 2023-01-22 DIAGNOSIS — R748 Abnormal levels of other serum enzymes: Secondary | ICD-10-CM

## 2023-01-22 LAB — HCV INTERPRETATION

## 2023-01-22 LAB — IRON,TIBC AND FERRITIN PANEL
Ferritin: 316 ng/mL — ABNORMAL HIGH (ref 15–150)
Iron Saturation: 24 % (ref 15–55)
Iron: 74 ug/dL (ref 27–139)
Total Iron Binding Capacity: 308 ug/dL (ref 250–450)
UIBC: 234 ug/dL (ref 118–369)

## 2023-01-22 LAB — ACUTE VIRAL HEPATITIS (HAV, HBV, HCV)
HCV Ab: NONREACTIVE
Hep A IgM: NEGATIVE
Hep B C IgM: NEGATIVE
Hepatitis B Surface Ag: NEGATIVE

## 2023-01-22 LAB — TSH: TSH: 3.4 u[IU]/mL (ref 0.450–4.500)

## 2023-01-22 LAB — GAMMA GT: GGT: 1297 IU/L (ref 0–60)

## 2023-01-22 LAB — MITOCHONDRIAL ANTIBODIES: Mitochondrial Ab: <20 U (ref 0.0–20.0)

## 2023-01-22 NOTE — Progress Notes (Signed)
Internal Medicine Clinic Attending  Case discussed with the resident at the time of the visit.  We reviewed the resident's history and exam and pertinent patient test results.  I agree with the assessment, diagnosis, and plan of care documented in the resident's note.     Dr. Allena Katz and I were surprised by her significantly elevated alk phos levels.  Update: She has returned to our lab, and GGT levels are also elevated. With elevated LFTs & GGT, I think she likely has a liver/gallbladder pathology. I agree with Dr. Eliane Decree plan for stat RUQ U/S (he is working to coordinate this) and urgent GI referral.

## 2023-01-22 NOTE — Addendum Note (Signed)
Addended by: Modena Slater on: 01/22/2023 08:54 AM   Modules accepted: Orders

## 2023-01-23 ENCOUNTER — Encounter: Payer: Medicare Other | Admitting: Student

## 2023-01-23 ENCOUNTER — Ambulatory Visit (HOSPITAL_BASED_OUTPATIENT_CLINIC_OR_DEPARTMENT_OTHER)
Admission: RE | Admit: 2023-01-23 | Discharge: 2023-01-23 | Disposition: A | Discharge status: Home / Self Care | Payer: Medicare Other | Source: Ambulatory Visit | Attending: Student in an Organized Health Care Education/Training Program | Admitting: Student in an Organized Health Care Education/Training Program

## 2023-01-23 DIAGNOSIS — R748 Abnormal levels of other serum enzymes: Secondary | ICD-10-CM | POA: Diagnosis present

## 2023-01-26 ENCOUNTER — Other Ambulatory Visit: Payer: Self-pay | Admitting: Student

## 2023-01-26 ENCOUNTER — Telehealth: Payer: Self-pay | Admitting: Student

## 2023-01-26 DIAGNOSIS — R748 Abnormal levels of other serum enzymes: Secondary | ICD-10-CM

## 2023-01-26 NOTE — Telephone Encounter (Signed)
Talked to patient regarding [place]. Will discuss with Dr. Lafonda Mosses about MRI decision. Called the patient and let her know. Will have patient come back for labs this week. Patient reports that ever since stopping the statin she is feeling much better. Referral for GI has been put in. Waiting for it to process.

## 2023-01-26 NOTE — Addendum Note (Signed)
Addended by: Modena Slater on: 01/26/2023 11:56 AM   Modules accepted: Orders

## 2023-01-31 ENCOUNTER — Encounter: Payer: Self-pay | Admitting: Student

## 2023-02-03 ENCOUNTER — Encounter: Payer: Self-pay | Admitting: Physician Assistant

## 2023-02-03 ENCOUNTER — Other Ambulatory Visit (INDEPENDENT_AMBULATORY_CARE_PROVIDER_SITE_OTHER): Payer: Medicare Other

## 2023-02-03 ENCOUNTER — Ambulatory Visit (INDEPENDENT_AMBULATORY_CARE_PROVIDER_SITE_OTHER): Payer: Medicare Other | Admitting: Physician Assistant

## 2023-02-03 ENCOUNTER — Telehealth: Payer: Self-pay

## 2023-02-03 VITALS — BP 122/80 | HR 75 | Ht 67.0 in | Wt 146.0 lb

## 2023-02-03 DIAGNOSIS — R932 Abnormal findings on diagnostic imaging of liver and biliary tract: Secondary | ICD-10-CM

## 2023-02-03 DIAGNOSIS — R748 Abnormal levels of other serum enzymes: Secondary | ICD-10-CM

## 2023-02-03 DIAGNOSIS — K7689 Other specified diseases of liver: Secondary | ICD-10-CM

## 2023-02-03 DIAGNOSIS — D1803 Hemangioma of intra-abdominal structures: Secondary | ICD-10-CM

## 2023-02-03 LAB — COMPREHENSIVE METABOLIC PANEL
ALT: 182 U/L — ABNORMAL HIGH (ref 0–35)
AST: 192 U/L — ABNORMAL HIGH (ref 0–37)
Albumin: 4 g/dL (ref 3.5–5.2)
Alkaline Phosphatase: 1747 U/L — ABNORMAL HIGH (ref 39–117)
BUN: 15 mg/dL (ref 6–23)
CO2: 31 meq/L (ref 19–32)
Calcium: 9.6 mg/dL (ref 8.4–10.5)
Chloride: 102 meq/L (ref 96–112)
Creatinine, Ser: 0.9 mg/dL (ref 0.40–1.20)
GFR: 64.86 mL/min (ref >60.00)
Glucose, Bld: 86 mg/dL (ref 70–99)
Potassium: 4.7 meq/L (ref 3.5–5.1)
Sodium: 139 meq/L (ref 135–145)
Total Bilirubin: 1.1 mg/dL (ref 0.2–1.2)
Total Protein: 7.5 g/dL (ref 6.0–8.3)

## 2023-02-03 NOTE — Telephone Encounter (Signed)
Agree with the assessment and plan as outlined by Hyacinth Meeker, PA-C.   Significant elevation in liver enzymes seemingly temporally related to starting atorvastatin during recent hospitalization for stroke.  Looks like they are downtrending appropriately, albeit with a still significantly elevated alkaline phosphatase.  Bilirubin is normal.  Ultrasound earlier this month with multiple hyperechoic liver lesions, likely hemangiomas.  Otherwise no duct dilation.   Interestingly, GGT significantly elevated at 1297.  Otherwise normal AMA, viral hep panel.     I think it is reasonable to repeat liver enzymes in 2-3 weeks.  If alkaline phosphatase still significantly elevated, plan for extended serologic workup along with MRI/MRCP.   Vito Cirigliano, DO, [facility]

## 2023-02-03 NOTE — Telephone Encounter (Signed)
-----   Message from Unk Lightning sent at 02/03/2023  1:28 PM EDT ----- Regarding: Elevated Alk phos Can you relay Dr. Frankey Shown addendum to the patient.  Lets recheck LFTs in 2 weeks.  Thanks, JL L ----- Message ----- From: Shellia Cleverly, DO Sent: 02/03/2023  12:20 PM EDT To: Unk Lightning, PA     ----- Message ----- From: Unk Lightning, [place] Sent: 02/03/2023   9:26 AM EDT To: Shellia Cleverly, DO  Previous [place] pt

## 2023-02-03 NOTE — Progress Notes (Signed)
Chief Complaint: Elevated alkaline phosphatase  HPI:    Martha Spencer is a 72 year old female, previously known to Dr. Orvan Falconer, with a past medical history as listed below including anxiety and depression, who was referred to me by Colbert Coyer, Prisci* for a complaint of elevated alkaline phosphatase.      03/15/2018 colonoscopy with melanosis in the colon, 4 to-3 mm polyps in the sigmoid colon and in the ascending colon, one 7 mm polyp in the distal ascending colon.  Pathology showed hyperplastic polyps.  Repeat recommended in 10 years.    12/08/2022 normal CMP.    12/06/2022-12/08/2022 patient admitted to the hospital for CVA, at that time started on a low-dose aspirin 81 mg daily, atorvastatin 80 mg daily, Plavix 75 mg daily.    01/12/2023 patient messaged her PCP and discussed extreme fatigue and that this is coming from the atorvastatin.  They discussed switching this.    01/20/2023 CMP with elevated alkaline phosphatase at 2093, AST 243 and ALT 207.  TSH normal.  AMA negative.  Iron studies with a ferritin of 316 otherwise normal.  GGT elevated at 1297.  Acute viral hepatitis panel negative.    01/23/2023 ultrasound with hyperechoic liver lesions, most likely hemangiomas, but technically indeterminate.  Recommended MRI.    Today, patient presents to clinic and tells me that she started the atorvastatin shortly after leaving the hospital from her stroke, about a few weeks later she started feeling very fatigued and called her PCP.  Apparently they discussed maybe switching her to another statin but this never happened.  She eventually just stopped the Atorvastatin completely 2 weeks ago and she has started to feel much better.  Has not had any repeat labs since initial drawl on 01/20/2023.  Does describe that her urine is sometimes darker than normal and was really dark during time of her fatigue.  Now just seems to come and go but also admits that she does not drink a lot of water.    Denies fever,  chills, weight loss, abdominal pain, nausea, vomiting or symptoms that awaken her from sleep.     Past Medical History:  Diagnosis Date   Anxiety    Depression    GERD (gastroesophageal reflux disease)    occ    Past Surgical History:  Procedure Laterality Date   ADENOIDECTOMY     COLONOSCOPY  2009   Dr Virginia Rochester -poor prep    EYE SURGERY     OOPHORECTOMY     WISDOM TOOTH EXTRACTION      Current Outpatient Medications  Medication Sig Dispense Refill   amLODipine (NORVASC) 5 MG tablet Take 1 tablet (5 mg total) by mouth daily. 90 tablet 3   aspirin 81 MG chewable tablet Chew 1 tablet (81 mg total) by mouth daily. 90 tablet 0   clobetasol cream (TEMOVATE) 0.05 % APPLY SPARINGLY TO AFFECTED AREA TWICE A DAY  0   clopidogrel (PLAVIX) 75 MG tablet Take 1 tablet (75 mg total) by mouth daily. 18 tablet 0   venlafaxine XR (EFFEXOR-XR) 75 MG 24 hr capsule Take 1 capsule (75 mg total) by mouth daily. 30 capsule 2   No current facility-administered medications for this visit.    Allergies as of 02/03/2023 - Review Complete 01/21/2023  Allergen Reaction Noted   Sulfa antibiotics  03/23/2013   Wheat Other (See Comments) 12/07/2022    Family History  Problem Relation Age of Onset   Colon cancer Neg Hx    Colon polyps Neg  Hx    Esophageal cancer Neg Hx    Rectal cancer Neg Hx    Stomach cancer Neg Hx     Social History   Socioeconomic History   Marital status: Divorced    Spouse name: Not on file   Number of children: Not on file   Years of education: Not on file   Highest education level: Not on file  Occupational History   Not on file  Tobacco Use   Smoking status: Never   Smokeless tobacco: Never  Vaping Use   Vaping status: Never Used  Substance and Sexual Activity   Alcohol use: No   Drug use: No   Sexual activity: Not on file  Other Topics Concern   Not on file  Social History Narrative   Not on file   Social Determinants of Health   Financial Resource Strain:  Low Risk  (01/20/2023)   Overall Financial Resource Strain (CARDIA)    Difficulty of Paying Living Expenses: Not hard at all  Food Insecurity: No Food Insecurity (01/20/2023)   Hunger Vital Sign    Worried About Running Out of Food in the Last Year: Never true    Ran Out of Food in the Last Year: Never true  Transportation Needs: No Transportation Needs (01/20/2023)   PRAPARE - Administrator, Civil Service (Medical): No    Lack of Transportation (Non-Medical): No  Physical Activity: Insufficiently Active (01/20/2023)   Exercise Vital Sign    Days of Exercise per Week: 1 day    Minutes of Exercise per Session: 20 min  Stress: No Stress Concern Present (01/20/2023)   [name]    Feeling of Stress : Only a little  Social Connections: Moderately Isolated (01/20/2023)   Social Connection and Isolation Panel [NHANES]    Frequency of Communication with Friends and Family: More than three times a week    Frequency of Social Gatherings with Friends and Family: Three times a week    Attends Religious Services: 1 to 4 times per year    Active Member of Clubs or Organizations: No    Attends Banker Meetings: Never    Marital Status: Divorced  Catering manager Violence: Not At Risk (01/20/2023)   Humiliation, Afraid, Rape, and Kick questionnaire    Fear of Current or Ex-Partner: No    Emotionally Abused: No    Physically Abused: No    Sexually Abused: No    Review of Systems:    Constitutional: No weight loss, fever or chills Skin: No rash  Cardiovascular: No chest pain Respiratory: No SOB  Gastrointestinal: See HPI and otherwise negative Genitourinary: No dysuria Neurological: No headache, dizziness or syncope Musculoskeletal: No new muscle or joint pain Hematologic: No bleeding Psychiatric: No history of depression or anxiety   Physical Exam:  Vital signs: BP 122/80   Pulse 75   Ht 5\' 7"  (1.702 m)    Wt 146 lb (66.2 kg)   BMI 22.87 kg/m    Constitutional:   Pleasant Caucasian female appears to be in NAD, Well developed, Well nourished, alert and cooperative Head:  Normocephalic and atraumatic. Eyes:   PEERL, EOMI. No icterus. Conjunctiva pink. Ears:  Normal auditory acuity. Neck:  Supple Throat: Oral cavity and pharynx without inflammation, swelling or lesion.  Respiratory: Respirations even and unlabored. Lungs clear to auscultation bilaterally.   No wheezes, crackles, or rhonchi.  Cardiovascular: Normal S1, S2. No MRG. Regular rate and  rhythm. No peripheral edema, cyanosis or pallor.  Gastrointestinal:  Soft, nondistended, nontender. No rebound or guarding. Normal bowel sounds. No appreciable masses or hepatomegaly. Rectal:  Not performed.  Msk:  Symmetrical without gross deformities. Without edema, no deformity or joint abnormality.  Neurologic:  Alert and  oriented x4;  grossly normal neurologically.  Skin:   Dry and intact without significant lesions or rashes. Psychiatric: Demonstrates good judgement and reason without abnormal affect or behaviors.  RELEVANT LABS AND IMAGING: CBC    Component Value Date/Time   WBC 5.5 12/08/2022 0748   RBC 4.47 12/08/2022 0748   HGB 13.6 12/08/2022 0748   HCT 41.0 12/08/2022 0748   PLT 227 12/08/2022 0748   MCV 91.7 12/08/2022 0748   MCH 30.4 12/08/2022 0748   MCHC 33.2 12/08/2022 0748   RDW 12.4 12/08/2022 0748   LYMPHSABS 1.5 12/06/2022 1029   MONOABS 0.6 12/06/2022 1029   EOSABS 0.2 12/06/2022 1029   BASOSABS 0.1 12/06/2022 1029    CMP     Component Value Date/Time   NA 141 01/20/2023 1006   K 4.7 01/20/2023 1006   CL 101 01/20/2023 1006   CO2 25 01/20/2023 1006   GLUCOSE 86 01/20/2023 1006   GLUCOSE 92 12/08/2022 0748   BUN 10 01/20/2023 1006   CREATININE 0.82 01/20/2023 1006   CALCIUM 9.6 01/20/2023 1006   PROT 7.2 01/20/2023 1006   ALBUMIN 4.5 01/20/2023 1006   AST 243 (H) 01/20/2023 1006   ALT 207 (H) 01/20/2023  1006   ALKPHOS 2,093 (HH) 01/20/2023 1006   BILITOT 0.8 01/20/2023 1006   GFRNONAA >60 12/08/2022 0748    Assessment: 1.  Elevated LFTs: See above, completely normal on 12/08/2022 during hospitalization for stroke, patient then started on Atorvastatin high-dose 80 mg, started feeling fatigued eventually stop this medicine, ultrasound with likely hemangiomas; likely DI LI from Atorvastatin  Plan: 1.  Recheck CMP today.  As long as liver enzymes are trending down now that she has stopped Atorvastatin 2 weeks ago, would keep her off of statin medications in the future and we will continue to monitor this until they normalize. 2.  If liver enzymes still elevated for some reason will need to order further lab testing.  Discussed this with the patient today. 3.  Do agree she needs an MRI for further workup of these liver lesions, though discussed they are most likely hemangiomas which are benign. 4.  Patient to follow in clinic with [place] per recommendations after labs today.  Assigned to Dr. Barron Alvine today.  Hyacinth Meeker, PA-C [facility] 02/03/2023, 9:01 AM  Cc: Colbert Coyer, Prisci*

## 2023-02-03 NOTE — Patient Instructions (Addendum)
_______________________________________________________  If your blood pressure at your visit was 140/90 or greater, please contact your primary care physician to follow up on this. _______________________________________________________  If you are age 70 or older, your body mass index should be between 23-30. Your Body mass index is 22.87 kg/m. If this is out of the aforementioned range listed, please consider follow up with your Primary Care Provider. ________________________________________________________  The [place] providers would like to encourage you to use [facility] to communicate with providers for non-urgent requests or questions.  Due to long hold times on the telephone, sending your provider a message by [facility] may be a faster and more efficient way to get a response.  Please allow 48 business hours for a response.  Please remember that this is for non-urgent requests.  _______________________________________________________  Your provider has requested that you go to the basement level for lab work before leaving today. Press "B" on the elevator. The lab is located at the first door on the left as you exit the elevator.  Due to recent changes in healthcare laws, you may see the results of your imaging and laboratory studies on MyChart before your provider has had a chance to review them.  We understand that in some cases there may be results that are confusing or concerning to you. Not all laboratory results come back in the same time frame and the provider may be waiting for multiple results in order to interpret others.  Please give [place] 48 hours in order for your provider to thoroughly review all the results before contacting the office for clarification of your results.   Thank you for entrusting me with your care and choosing [facility].  Hyacinth Meeker, PA-C

## 2023-02-03 NOTE — Progress Notes (Signed)
Agree with the assessment and plan as outlined by Hyacinth Meeker, PA-C.   Significant elevation in liver enzymes seemingly temporally related to starting atorvastatin during recent hospitalization for stroke.  Looks like they are downtrending appropriately, albeit with a still significantly elevated alkaline phosphatase.  Bilirubin is normal.  Ultrasound earlier this month with multiple hyperechoic liver lesions, likely hemangiomas.  Otherwise no duct dilation.   Interestingly, GGT significantly elevated at 1297.  Otherwise normal AMA, viral hep panel.     I think it is reasonable to repeat liver enzymes in 2-3 weeks.  If alkaline phosphatase still significantly elevated, plan for extended serologic workup along with MRI/MRCP.   Aireana Ryland, DO, [facility]

## 2023-02-03 NOTE — Telephone Encounter (Signed)
Called and spoke with patient regarding additional recommendations. Pt is aware that we will repeat liver enzymes in 2 weeks. If alk phos is still significantly elevated she may need additional labs/imaging. Pt verbalized understanding and had no concerns at the end of the call.   2-week lab reminder and order in epic

## 2023-02-05 ENCOUNTER — Other Ambulatory Visit: Payer: Self-pay | Admitting: Student

## 2023-02-05 DIAGNOSIS — R748 Abnormal levels of other serum enzymes: Secondary | ICD-10-CM

## 2023-02-05 NOTE — Addendum Note (Signed)
Addended by: Modena Slater on: 02/05/2023 07:14 PM   Modules accepted: Orders

## 2023-02-06 ENCOUNTER — Ambulatory Visit (HOSPITAL_COMMUNITY)
Admission: RE | Admit: 2023-02-06 | Discharge: 2023-02-06 | Disposition: A | Discharge status: Home / Self Care | Payer: Medicare Other | Source: Ambulatory Visit | Attending: Internal Medicine | Admitting: Internal Medicine

## 2023-02-06 ENCOUNTER — Other Ambulatory Visit (INDEPENDENT_AMBULATORY_CARE_PROVIDER_SITE_OTHER): Payer: Medicare Other

## 2023-02-06 DIAGNOSIS — R748 Abnormal levels of other serum enzymes: Secondary | ICD-10-CM

## 2023-02-06 DIAGNOSIS — I6381 Other cerebral infarction due to occlusion or stenosis of small artery: Secondary | ICD-10-CM

## 2023-02-06 MED ORDER — GADOBUTROL 1 MMOL/ML IV SOLN
6.0000 mL | Freq: Once | INTRAVENOUS | Status: AC | PRN
  Administered 2023-02-06: 6 mL via INTRAVENOUS

## 2023-02-08 LAB — CMP14 + ANION GAP
ALT: 182 IU/L — ABNORMAL HIGH (ref 0–32)
AST: 201 IU/L — ABNORMAL HIGH (ref 0–40)
Albumin: 4.4 g/dL (ref 3.9–4.9)
Alkaline Phosphatase: 1888 IU/L (ref 44–121)
Anion Gap: 16 mmol/L (ref 10.0–18.0)
BUN/Creatinine Ratio: 15 (ref 12–28)
BUN: 14 mg/dL (ref 8–27)
Bilirubin Total: 0.5 mg/dL (ref 0.0–1.2)
CO2: 23 mmol/L (ref 20–29)
Calcium: 9.4 mg/dL (ref 8.7–10.3)
Chloride: 104 mmol/L (ref 96–106)
Creatinine, Ser: 0.94 mg/dL (ref 0.57–1.00)
Globulin, Total: 2.7 g/dL (ref 1.5–4.5)
Glucose: 85 mg/dL (ref 70–99)
Potassium: 4.3 mmol/L (ref 3.5–5.2)
Sodium: 143 mmol/L (ref 134–144)
Total Protein: 7.1 g/dL (ref 6.0–8.5)
eGFR: 65 mL/min/{1.73_m2} (ref >59)

## 2023-02-08 NOTE — Progress Notes (Signed)
Internal Medicine Clinic Attending  Case, documentation, and findings reviewed. I agree with the assessment, diagnosis, and plan of care as outlined in the AWV note.     

## 2023-02-09 ENCOUNTER — Encounter: Payer: Self-pay | Admitting: Student

## 2023-02-09 ENCOUNTER — Telehealth: Payer: Self-pay | Admitting: *Deleted

## 2023-02-09 NOTE — Addendum Note (Signed)
Addended by: Modena Slater on: 02/09/2023 02:32 PM   Modules accepted: Orders

## 2023-02-10 ENCOUNTER — Encounter: Payer: Self-pay | Admitting: Student

## 2023-02-10 NOTE — Telephone Encounter (Signed)
No appts available within the next few weeks. I had Dr. Barron Alvine review patient's most recent labs. Please see Dr. Frankey Shown recommendations below:  Looks like she had an MRI done, but not yet read. Hopefully that was an MRI/MRCP.  I am ok to see her this afternoon, or can wait to see what MRI shows and direct her care from there w/o need for appointment really.   Called pt and relayed recommendations. Pt will await results from MRI and further recommendation at that time. Pt verbalized understanding and had no concerns at the end of the call.  Will have Dr. Barron Alvine review MRI report once available.

## 2023-02-10 NOTE — Telephone Encounter (Signed)
Inbound call from patient requesting to speak with a nurse in regards to here being scheduled for OV in October.   Patient declined to  schedule with me at the time due to availability. Please advise.   Thank you

## 2023-02-10 NOTE — Telephone Encounter (Signed)
[facility] Radiology reading room and spoke with Gabe. They will have MRI read.

## 2023-02-11 ENCOUNTER — Encounter: Payer: Self-pay | Admitting: Student

## 2023-02-12 ENCOUNTER — Encounter: Payer: Self-pay | Admitting: Student

## 2023-02-12 ENCOUNTER — Telehealth: Payer: Self-pay | Admitting: Student

## 2023-02-12 NOTE — Telephone Encounter (Signed)
Called and spoke with patient regarding MRI results and Dr. Frankey Shown recommendations as outlined below. Pt knows to stop by the [facility] lab at her earliest convenience to have additional labs drawn, no appt needed. Pt knows to expect a call from radiology to set up liver biopsy and a call from [facility] in the next couple of weeks to schedule office consultation. Pt verbalized understanding of all information and had no concerns at the end of the call.   Lab orders in epic.  US liver biopsy order in epic. Secure staff message sent to radiology scheduling to contact patient to set up appt.    Referral, records, pt's demographic and insurance information faxed to [facility] (P: [phone], F: [phone])

## 2023-02-12 NOTE — Telephone Encounter (Signed)
MRI report is available.

## 2023-02-12 NOTE — Telephone Encounter (Signed)
Patient's MRI results have been released.  I called the patient and went over the results with her.  It looks like gastroenterology has also called her as well.  Plan moving forward is to get more labs, which patient will come to the lab to get.  Will also obtain liver biopsy, in which radiology will call her per gastroenterology.  Will also plan to send patient to a lipid clinic.  Will continue to follow along and ensure all of this is done.

## 2023-02-12 NOTE — Telephone Encounter (Signed)
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  Otherwise normal gallbladder, pancreas, spleen.  Normal visualized GI tract without any areas of inflammation, distention, obstruction.  Large stool burden noted throughout the colon.  While this was not MRCP protocol, this is a pretty reassuring study.  Repeat labs from last week show persistently elevated alkaline phosphatase at 1888, with stable elevation of AST/ALT at 201/182.  Otherwise normal bilirubin.    So far, extended evaluation notable for normal iron panel, AMA, acute viral hepatitis panel, TSH, CK.  GGT significantly elevated at 1297.  Plan for the following: - Check ASMA, ANA ceruloplasmin, alpha 1 antitrypsin, anti-liver-kidney microsomal antibody, IgG, IgM, IgA, TTG - Liver biopsy - Referral to the Hepatology Clinic

## 2023-02-12 NOTE — Telephone Encounter (Signed)
I have again contacted [facility] Imaging reading room and have asked Elnita Maxwell to have MRCP read asap. She states she will do this.

## 2023-02-12 NOTE — Addendum Note (Signed)
Addended by: Missy Sabins on: 02/12/2023 01:19 PM   Modules accepted: Orders

## 2023-02-13 NOTE — Progress Notes (Unsigned)
Oley Balm, MD  Dorise Hiss Ok [place] random liver  DDH

## 2023-02-16 ENCOUNTER — Telehealth: Payer: Self-pay | Admitting: Student

## 2023-02-16 DIAGNOSIS — R748 Abnormal levels of other serum enzymes: Secondary | ICD-10-CM

## 2023-02-16 NOTE — Telephone Encounter (Signed)
Called patient to go over all of her future appointments with her regarding the next steps. The Patient has a Liver care appointment set up which she is aware of and she also has an appointment for the liver biopsy that is set up. She has also had a lipid clinic appointment set up. She is aware of all of these appointment and she is going to go to all of the appointments. She did have a question about the new labs that her GI doctor put in for her and she is wondering if she can come to the [facility] to get these drawn. I did reach out to the [facility] to see if she can get these drawn here. Looks like she will be able to get them drawn here. I also went ahead and added CMP to those orders. When patient does come in, please draw both my orders and Dr. Frankey Shown orders.

## 2023-02-16 NOTE — Addendum Note (Signed)
Addended by: Bufford Spikes on: 02/16/2023 02:13 PM   Modules accepted: Orders

## 2023-02-16 NOTE — Telephone Encounter (Signed)
Patient has an appointment with [facility] on 03-09-23 at 1:30pm.

## 2023-02-16 NOTE — Addendum Note (Signed)
Addended by: Bufford Spikes on: 02/16/2023 02:12 PM   Modules accepted: Orders

## 2023-02-16 NOTE — Addendum Note (Signed)
Addended by: Bufford Spikes on: 02/16/2023 02:11 PM   Modules accepted: Orders

## 2023-02-20 ENCOUNTER — Other Ambulatory Visit: Payer: Self-pay | Admitting: *Deleted

## 2023-02-20 ENCOUNTER — Encounter: Payer: Self-pay | Admitting: Student

## 2023-02-20 ENCOUNTER — Telehealth: Payer: Self-pay | Admitting: *Deleted

## 2023-02-20 DIAGNOSIS — K5909 Other constipation: Secondary | ICD-10-CM

## 2023-02-20 NOTE — Telephone Encounter (Signed)
Called patient to inform of the blood draw due. Patient was at [facility] trying to have blood drawn there for another physician and was stressed and flustered. Informed the patient she could have her blood drawn another time next week and not to worry about having it done today. Instructed patient to go home when she finished at [facility] due to the frustration noted in her voice. Patient agreed.

## 2023-02-20 NOTE — Telephone Encounter (Signed)
-----   Message from Nurse Wilson Singer sent at 02/20/2023 11:20 AM EDT ----- Regarding: FW: 2-weeks lab reminder  ----- Message ----- From: Missy Sabins, RN Sent: 02/17/2023  12:00 AM EDT To: Missy Sabins, RN Subject: 2-weeks lab reminder                           Hepatic function panel - order is in epic

## 2023-02-21 ENCOUNTER — Other Ambulatory Visit: Payer: Self-pay

## 2023-02-21 ENCOUNTER — Other Ambulatory Visit: Payer: Self-pay | Admitting: Student

## 2023-02-22 ENCOUNTER — Other Ambulatory Visit: Payer: Self-pay

## 2023-02-23 ENCOUNTER — Other Ambulatory Visit: Payer: Self-pay | Admitting: Student

## 2023-02-23 MED ORDER — ASPIRIN 81 MG PO CHEW: 81.0000 mg | CHEWABLE_TABLET | Freq: Every day | ORAL | 3 refills | Status: DC

## 2023-02-23 NOTE — Progress Notes (Signed)
Call to patient states she spoke with Dr. Allena Katz and will have lab work done at [facility].

## 2023-02-24 ENCOUNTER — Other Ambulatory Visit: Payer: Medicare Other

## 2023-02-24 DIAGNOSIS — K7689 Other specified diseases of liver: Secondary | ICD-10-CM

## 2023-02-24 DIAGNOSIS — R932 Abnormal findings on diagnostic imaging of liver and biliary tract: Secondary | ICD-10-CM

## 2023-02-24 DIAGNOSIS — D1803 Hemangioma of intra-abdominal structures: Secondary | ICD-10-CM

## 2023-02-24 DIAGNOSIS — R748 Abnormal levels of other serum enzymes: Secondary | ICD-10-CM

## 2023-02-25 ENCOUNTER — Encounter: Payer: Self-pay | Admitting: Neurology

## 2023-02-25 ENCOUNTER — Other Ambulatory Visit: Payer: Medicare Other

## 2023-02-25 ENCOUNTER — Ambulatory Visit (INDEPENDENT_AMBULATORY_CARE_PROVIDER_SITE_OTHER): Payer: Medicare Other | Admitting: Neurology

## 2023-02-25 VITALS — BP 128/85 | HR 69 | Ht 67.0 in | Wt 144.5 lb

## 2023-02-25 DIAGNOSIS — I63512 Cerebral infarction due to unspecified occlusion or stenosis of left middle cerebral artery: Secondary | ICD-10-CM

## 2023-02-25 DIAGNOSIS — R748 Abnormal levels of other serum enzymes: Secondary | ICD-10-CM

## 2023-02-25 NOTE — Patient Instructions (Signed)
Continue to downtrend the LFTs, and if normalized, consider Repatha in the future  Continue your other medications  Continue to follow up with Dr. Allena Katz  Return as needed

## 2023-02-25 NOTE — Progress Notes (Signed)
[facility]  PATIENT: Martha Spencer DOB: 02-05-1951  REQUESTING CLINICIAN: Lynnae January, NP HISTORY FROM: Patient/Chart review REASON FOR VISIT: Left corona radiata stroke    HISTORICAL  CHIEF COMPLAINT:  Chief Complaint  Patient presents with   New Patient (Initial Visit)    Rm12, alone, NP internal referral for CVA: doing pretty well now. She stated lipitor caused fatigue and lft elevation.     HISTORY OF PRESENT ILLNESS:  This is 72 year old woman past medical history of hypertension hyperlipidemia, anxiety/depression who is presenting after being admitted to the hospital for a left corona radiata stroke.  Patient reports on July 20, she was experiencing dysarthria.  Slurring her speech.  She tells me that the symptom acutely started a day before but on the 20th she presented to the hospital.  Workup indicated a left corona radiata stroke.  She denies any focal neurological deficit, only speech was involved.  She was started on aspirin Plavix for 21 days and continue on aspirin alone.  She was also started on Lipitor.  While on Lipitor she have side effect, elevated liver enzyme, Lipitor discontinued and her liver enzymes are downtrending.  She continue to follow-up with PCP Dr. Allena Katz.    OTHER MEDICAL CONDITIONS: Hypertension, Hyperlipidemia    REVIEW OF SYSTEMS: Full 14 system review of systems performed and negative with exception of: As noted in the HPI   ALLERGIES: Allergies  Allergen Reactions   Statins Other (See Comments)    Significant elevation in LFTs with liver injury   Sulfa Antibiotics     Unknown reaction   Wheat Other (See Comments)    Glutton Free    HOME MEDICATIONS: Outpatient Medications Prior to Visit  Medication Sig Dispense Refill   amLODipine (NORVASC) 5 MG tablet Take 1 tablet (5 mg total) by mouth daily. 90 tablet 3   aspirin 81 MG chewable tablet Chew 1 tablet (81 mg total) by mouth daily. 90 tablet 3   clobetasol cream  (TEMOVATE) 0.05 % APPLY SPARINGLY TO AFFECTED AREA TWICE A DAY  0   venlafaxine XR (EFFEXOR-XR) 75 MG 24 hr capsule Take 1 capsule (75 mg total) by mouth daily. 30 capsule 2   clopidogrel (PLAVIX) 75 MG tablet Take 1 tablet (75 mg total) by mouth daily. (Patient not taking: Reported on 02/03/2023) 18 tablet 0   No facility-administered medications prior to visit.    PAST MEDICAL HISTORY: Past Medical History:  Diagnosis Date   Anxiety    Depression    GERD (gastroesophageal reflux disease)    occ    PAST SURGICAL HISTORY: Past Surgical History:  Procedure Laterality Date   ADENOIDECTOMY     COLONOSCOPY  2009   Dr Virginia Rochester -poor prep    EYE SURGERY     OOPHORECTOMY     WISDOM TOOTH EXTRACTION      FAMILY HISTORY: Family History  Problem Relation Age of Onset   Colon cancer Neg Hx    Colon polyps Neg Hx    Esophageal cancer Neg Hx    Rectal cancer Neg Hx    Stomach cancer Neg Hx     SOCIAL HISTORY: Social History   Socioeconomic History   Marital status: Divorced    Spouse name: Not on file   Number of children: 0   Years of education: Not on file   Highest education level: Not on file  Occupational History   Occupation: music teacher  Tobacco Use   Smoking status: Never  Smokeless tobacco: Never  Vaping Use   Vaping status: Never Used  Substance and Sexual Activity   Alcohol use: No   Drug use: No   Sexual activity: Not on file  Other Topics Concern   Not on file  Social History Narrative   Not on file   Social Determinants of Health   Financial Resource Strain: Low Risk  (01/20/2023)   Overall Financial Resource Strain (CARDIA)    Difficulty of Paying Living Expenses: Not hard at all  Food Insecurity: No Food Insecurity (01/20/2023)   Hunger Vital Sign    Worried About Running Out of Food in the Last Year: Never true    Ran Out of Food in the Last Year: Never true  Transportation Needs: No Transportation Needs (01/20/2023)   PRAPARE - Therapist, art (Medical): No    Lack of Transportation (Non-Medical): No  Physical Activity: Insufficiently Active (01/20/2023)   Exercise Vital Sign    Days of Exercise per Week: 1 day    Minutes of Exercise per Session: 20 min  Stress: No Stress Concern Present (01/20/2023)   [name]    Feeling of Stress : Only a little  Social Connections: Moderately Isolated (01/20/2023)   Social Connection and Isolation Panel [NHANES]    Frequency of Communication with Friends and Family: More than three times a week    Frequency of Social Gatherings with Friends and Family: Three times a week    Attends Religious Services: 1 to 4 times per year    Active Member of Clubs or Organizations: No    Attends Banker Meetings: Never    Marital Status: Divorced  Catering manager Violence: Not At Risk (01/20/2023)   Humiliation, Afraid, Rape, and Kick questionnaire    Fear of Current or Ex-Partner: No    Emotionally Abused: No    Physically Abused: No    Sexually Abused: No    PHYSICAL EXAM  GENERAL EXAM/CONSTITUTIONAL: Vitals:  Vitals:   02/25/23 0941  BP: 128/85  Pulse: 69  Weight: 144 lb 8 oz (65.5 kg)  Height: 5\' 7"  (1.702 m)   Body mass index is 22.63 kg/m. Wt Readings from Last 3 Encounters:  02/25/23 144 lb 8 oz (65.5 kg)  02/03/23 146 lb (66.2 kg)  01/20/23 146 lb 12.8 oz (66.6 kg)   Patient is in no distress; well developed, nourished and groomed; neck is supple  MUSCULOSKELETAL: Gait, strength, tone, movements noted in Neurologic exam below  NEUROLOGIC: MENTAL STATUS:      No data to display         awake, alert, oriented to person, place and time recent and remote memory intact normal attention and concentration language fluent, comprehension intact, naming intact, no dysarthria and no aphasia noted on examination  fund of knowledge appropriate  CRANIAL NERVE:  2nd, 3rd, 4th, 6th -  Visual fields full to confrontation, extraocular muscles intact, no nystagmus 5th - facial sensation symmetric 7th - facial strength symmetric 8th - hearing intact 9th - palate elevates symmetrically, uvula midline 11th - shoulder shrug symmetric 12th - tongue protrusion midline  MOTOR:  normal bulk and tone, full strength in the BUE, BLE  SENSORY:  normal and symmetric to light touch  COORDINATION:  finger-nose-finger, fine finger movements normal  GAIT/STATION:  normal     DIAGNOSTIC DATA (LABS, IMAGING, TESTING) - I reviewed patient records, labs, notes, testing and imaging myself where available.  Lab Results  Component Value Date   WBC 5.5 12/08/2022   HGB 13.6 12/08/2022   HCT 41.0 12/08/2022   MCV 91.7 12/08/2022   PLT 227 12/08/2022      Component Value Date/Time   NA 141 02/24/2023 1309   K 3.7 02/24/2023 1309   CL 102 02/24/2023 1309   CO2 24 02/24/2023 1309   GLUCOSE 119 (H) 02/24/2023 1309   GLUCOSE 86 02/03/2023 0928   BUN 14 02/24/2023 1309   CREATININE 0.82 02/24/2023 1309   CALCIUM 9.5 02/24/2023 1309   PROT 7.0 02/24/2023 1309   ALBUMIN 4.2 02/24/2023 1309   AST 63 (H) 02/24/2023 1309   ALT 57 (H) 02/24/2023 1309   ALKPHOS 940 (H) 02/24/2023 1309   BILITOT 0.6 02/24/2023 1309   GFRNONAA >60 12/08/2022 0748   Lab Results  Component Value Date   CHOL 214 (H) 12/07/2022   HDL 49 12/07/2022   LDLCALC 139 (H) 12/07/2022   TRIG 130 12/07/2022   CHOLHDL 4.4 12/07/2022   Lab Results  Component Value Date   HGBA1C 5.6 12/06/2022   No results found for: "VITAMINB12" Lab Results  Component Value Date   TSH 3.400 01/21/2023    MRI Brain 12/06/2022 1. Acute infarct in the left corona radiata. 2. Extensive chronic small vessel ischemic disease.  CTA Head and Neck 12/06/2022 1. No intracranial large vessel occlusion or significant stenosis. 2. No hemodynamically significant stenosis in the neck. 3. See same day CT head and brain MRI for  intracranial findings.    ASSESSMENT AND PLAN  72 y.o. year old female with hypertension, hyperlipidemia, anxiety/depression who is presenting after an episode of dysarthria found to have a left corona radiata stroke.  Stroke etiology likely small vessel disease but cannot to rule out large vessel disease.  She completed DAPT, aspirin and Plavix for 21 days, currently on Aspirin alone; while on Lipitor she developed elevated liver enzyme.  Lipitor discontinued and liver enzymes downtrending.  As of now I would recommend patient to continue her current medications, continue to downtrend liver enzyme and once normalized they can consider Repatha for the hyperlipidemia.  Continue to follow with PCP and return as needed.   1. Cerebrovascular accident (CVA) due to occlusion of left middle cerebral artery (HCC)   2. Elevated liver enzymes      Patient Instructions  Continue to downtrend the LFTs, and if normalized, consider Repatha in the future  Continue your other medications  Continue to follow up with Dr. Allena Katz  Return as needed   No orders of the defined types were placed in this encounter.   No orders of the defined types were placed in this encounter.   Return if symptoms worsen or fail to improve.  I have spent a total of 45 minutes dedicated to this patient today, preparing to see patient, performing a medically appropriate examination and evaluation, ordering tests and/or medications and procedures, and counseling and educating the patient/family/caregiver; independently interpreting result and communicating results to the family/patient/caregiver; and documenting clinical information in the electronic medical record.   Windell Norfolk, MD 02/25/2023, 10:17 AM  [facility] [address], [address] [place], [place] [address] [phone]

## 2023-02-26 LAB — CMP14 + ANION GAP
ALT: 57 [IU]/L — ABNORMAL HIGH (ref 0–32)
AST: 63 [IU]/L — ABNORMAL HIGH (ref 0–40)
Albumin: 4.2 g/dL (ref 3.9–4.9)
Alkaline Phosphatase: 940 [IU]/L — ABNORMAL HIGH (ref 44–121)
Anion Gap: 15 mmol/L (ref 10.0–18.0)
BUN/Creatinine Ratio: 17 (ref 12–28)
BUN: 14 mg/dL (ref 8–27)
Bilirubin Total: 0.6 mg/dL (ref 0.0–1.2)
CO2: 24 mmol/L (ref 20–29)
Calcium: 9.5 mg/dL (ref 8.7–10.3)
Chloride: 102 mmol/L (ref 96–106)
Creatinine, Ser: 0.82 mg/dL (ref 0.57–1.00)
Globulin, Total: 2.8 g/dL (ref 1.5–4.5)
Glucose: 119 mg/dL — ABNORMAL HIGH (ref 70–99)
Potassium: 3.7 mmol/L (ref 3.5–5.2)
Sodium: 141 mmol/L (ref 134–144)
Total Protein: 7 g/dL (ref 6.0–8.5)
eGFR: 77 mL/min/{1.73_m2} (ref >59)

## 2023-02-26 LAB — ALPHA-1-ANTITRYPSIN: A-1 Antitrypsin: 162 mg/dL (ref 101–187)

## 2023-02-26 LAB — TISSUE TRANSGLUTAMINASE ABS,IGG,IGA
Tissue Transglut Ab: <2 U/mL (ref 0–5)
Transglutaminase IgA: <2 U/mL (ref 0–3)

## 2023-02-26 LAB — IGM: IgM (Immunoglobulin M), Srm: 68 mg/dL (ref 26–217)

## 2023-02-26 LAB — ANTI-SMOOTH MUSCLE ANTIBODY, IGG: Smooth Muscle Ab: 16 U (ref 0–19)

## 2023-02-26 LAB — IGG: IgG (Immunoglobin G), Serum: 1090 mg/dL (ref 586–1602)

## 2023-02-26 LAB — CERULOPLASMIN: Ceruloplasmin: 33.6 mg/dL (ref 19.0–39.0)

## 2023-02-26 LAB — ANA: Anti Nuclear Antibody (ANA): NEGATIVE

## 2023-02-26 LAB — IGA: IgA/Immunoglobulin A, Serum: 246 mg/dL (ref 87–352)

## 2023-02-26 LAB — ANTI-MICROSOMAL ANTIBODY LIVER / KIDNEY: LKM1 Ab: 0.8 U (ref 0.0–20.0)

## 2023-03-04 ENCOUNTER — Ambulatory Visit (HOSPITAL_COMMUNITY): Payer: Medicare Other

## 2023-03-16 ENCOUNTER — Ambulatory Visit (HOSPITAL_COMMUNITY): Payer: Medicare Other

## 2023-04-17 ENCOUNTER — Other Ambulatory Visit: Payer: Self-pay | Admitting: Student

## 2023-05-06 ENCOUNTER — Ambulatory Visit (INDEPENDENT_AMBULATORY_CARE_PROVIDER_SITE_OTHER): Payer: Medicare Other | Admitting: Student

## 2023-05-06 ENCOUNTER — Encounter: Payer: Medicare Other | Admitting: Student

## 2023-05-06 VITALS — BP 117/72 | HR 63 | Temp 97.9°F | Ht 67.0 in | Wt 148.4 lb

## 2023-05-06 DIAGNOSIS — F32A Depression, unspecified: Secondary | ICD-10-CM

## 2023-05-06 DIAGNOSIS — I6381 Other cerebral infarction due to occlusion or stenosis of small artery: Secondary | ICD-10-CM | POA: Diagnosis not present

## 2023-05-06 DIAGNOSIS — I10 Essential (primary) hypertension: Secondary | ICD-10-CM | POA: Diagnosis not present

## 2023-05-06 DIAGNOSIS — K719 Toxic liver disease, unspecified: Secondary | ICD-10-CM | POA: Diagnosis not present

## 2023-05-06 DIAGNOSIS — D1803 Hemangioma of intra-abdominal structures: Secondary | ICD-10-CM

## 2023-05-06 NOTE — Assessment & Plan Note (Signed)
Lab Results  Component Value Date   ALT 57 (H) 02/24/2023   AST 63 (H) 02/24/2023   GGT 1,297 (HH) 01/21/2023   ALKPHOS 940 (H) 02/24/2023   BILITOT 0.6 02/24/2023    Per the last visit on 09/03, she was stopped on amlodipine-atorvastatin combination due to adverse effect, she presented with fatigue and concern for brown urine. During this visit [09/03] labs were checked that showed significantly elevated alkaline phosphatase 2,093, GGT 1,297. Urgent referral to GI was placed and RUQ [place]. Autoimmune workup within normal range, Hepatitis panel normal. Extended lab work per GI unremarkable that included  normal alpha 1 antitrypsin, ANA, ceruloplasmin, IgM, IgG, IgA, ASMA, TTG, LKM1 Ab. Liver US showed hemangiomas, MR abdomen concurrent w/ hemangioma of the inferior right lobe of the liver.  Most recent liver function on 11/22 showed improving Alk Phos 321, AST 47, ALT 57.  Currently, she reports mild epigastric pain that started about a week ago, that comes and goes.  Denies any nausea or vomiting.  Denies any abnormal bowel movements.  Reports that the pain is tolerable, has no concern at this time.  Per the physical exam, no tenderness to palpation noted in the epigastric region.  Plan: - Will repeat liver function test today - Continue to follow up with GI, next appointment on 08/24/2023

## 2023-05-06 NOTE — Assessment & Plan Note (Signed)
Noted on the RUQ [place], GI following, no current interventions at this time.

## 2023-05-06 NOTE — Assessment & Plan Note (Signed)
Patient had a left corona radiata stroke on 12/06/2022, she is currently on aspirin 81 mg daily.  She has recently seen her neurologist, they were considering Repatha for her hyperlipidemia as she is unable to tolerate statin.  Otherwise patient said that she is concerned about her lipid panel, as she is currently not on any medication.  Plan: -Continue aspirin 81 mg daily -Follow-up on lipid panel

## 2023-05-06 NOTE — Patient Instructions (Addendum)
Thank you, Martha Spencer for allowing [place] to provide your care today. Today we discussed your labs.    - We will continue to monitor your liver function labs. I will call you and update you on labs.  - Otherwise, please continue taking amlodipine 5 mg for your hypertension, venlafaxine 75 mg for your depression, aspirin 81 mg for your history of stroke.   I have ordered the following labs for you:  Lab Orders         Renal function panel         Lipid Profile       Tests ordered today:  RFP and lipid panel  Referrals ordered today:   Referral Orders  No referral(s) requested today     I have ordered the following medication/changed the following medications:   Stop the following medications: There are no discontinued medications.   Start the following medications: No orders of the defined types were placed in this encounter.    Follow up: 3 months for hypertension, liver function test   Remember:   Should you have any questions or concerns please call the internal medicine clinic at [phone].     Jeral Pinch, DO [facility]

## 2023-05-06 NOTE — Progress Notes (Signed)
Established Patient Office Visit  Subjective   Patient ID: Martha Spencer, female    DOB: 1950-07-05  Age: 72 y.o. MRN: [id number]  Chief Complaint  Patient presents with   Follow-up    Routine visit , requesting labs    HPI  Patient Active Problem List   Diagnosis Date Noted   Hepatic hemangioma 05/06/2023   Drug-induced liver injury, Statin intolerance 01/20/2023   Essential hypertension 12/22/2022   Depression 12/22/2022   CVA (cerebral vascular accident) (HCC) 12/06/2022   Chronic constipation 04/11/2013   Past Medical History:  Diagnosis Date   Anxiety    Depression    GERD (gastroesophageal reflux disease)    occ   Past Surgical History:  Procedure Laterality Date   ADENOIDECTOMY     COLONOSCOPY  2009   Dr Virginia Rochester -poor prep    EYE SURGERY     OOPHORECTOMY     WISDOM TOOTH EXTRACTION     Social History   Tobacco Use   Smoking status: Never   Smokeless tobacco: Never  Vaping Use   Vaping status: Never Used  Substance Use Topics   Alcohol use: No   Drug use: No   Family Status  Relation Name Status   Mother  Alive   Father  Deceased   Neg Hx  (Not Specified)  No partnership data on file   Family History  Problem Relation Age of Onset   Colon cancer Neg Hx    Colon polyps Neg Hx    Esophageal cancer Neg Hx    Rectal cancer Neg Hx    Stomach cancer Neg Hx    Allergies  Allergen Reactions   Statins Other (See Comments)    Significant elevation in LFTs with liver injury   Sulfa Antibiotics     Unknown reaction   Wheat Other (See Comments)    Glutton Free    ROS   ROS negative except for what is noted on the assessment and plan.   Objective:     BP 117/72 (BP Location: Right Arm, Patient Position: Sitting, Cuff Size: Normal)   Pulse 63   Temp 97.9 F (36.6 C) (Oral)   Ht 5\' 7"  (1.702 m)   Wt 148 lb 6.4 oz (67.3 kg)   SpO2 99%   BMI 23.24 kg/m  BP Readings from Last 3 Encounters:  05/06/23 117/72  02/25/23 128/85  02/03/23 122/80    Wt Readings from Last 3 Encounters:  05/06/23 148 lb 6.4 oz (67.3 kg)  02/25/23 144 lb 8 oz (65.5 kg)  02/03/23 146 lb (66.2 kg)      Physical Exam  General: Sitting in chair, no acute distress Cardiovascular: Regular rate, regular rhythm, no murmurs/rubs/gallops.  No lower extremity edema bilaterally. Pulmonary: Lungs are clear to auscultation bilaterally, no wheezing or crackles Abd: Soft, nontender, nondistended.  Bowel sounds present. MSK: Range of motion intact Neuro: No focal deficits  No results found for any visits on 05/06/23.  Last CBC Lab Results  Component Value Date   WBC 5.5 12/08/2022   HGB 13.6 12/08/2022   HCT 41.0 12/08/2022   MCV 91.7 12/08/2022   MCH 30.4 12/08/2022   RDW 12.4 12/08/2022   PLT 227 12/08/2022   Last metabolic panel Lab Results  Component Value Date   GLUCOSE 119 (H) 02/24/2023   NA 141 02/24/2023   K 3.7 02/24/2023   CL 102 02/24/2023   CO2 24 02/24/2023   BUN 14 02/24/2023   CREATININE 0.82 02/24/2023  EGFR 77 02/24/2023   CALCIUM 9.5 02/24/2023   PROT 7.0 02/24/2023   ALBUMIN 4.2 02/24/2023   LABGLOB 2.8 02/24/2023   BILITOT 0.6 02/24/2023   ALKPHOS 940 (H) 02/24/2023   AST 63 (H) 02/24/2023   ALT 57 (H) 02/24/2023   ANIONGAP 6 12/08/2022   Last lipids Lab Results  Component Value Date   CHOL 214 (H) 12/07/2022   HDL 49 12/07/2022   LDLCALC 139 (H) 12/07/2022   TRIG 130 12/07/2022   CHOLHDL 4.4 12/07/2022   Last hemoglobin A1c Lab Results  Component Value Date   HGBA1C 5.6 12/06/2022      The ASCVD Risk score (Arnett DK, et al., 2019) failed to calculate for the following reasons:   Risk score cannot be calculated because patient has a medical history suggesting prior/existing ASCVD    Assessment & Plan:   Problem List Items Addressed This Visit       Cardiovascular and Mediastinum   CVA (cerebral vascular accident) [facility]) - Primary   Patient had a left corona radiata stroke on 12/06/2022, she is  currently on aspirin 81 mg daily.  She has recently seen her neurologist, they were considering Repatha for her hyperlipidemia as she is unable to tolerate statin.  Otherwise patient said that she is concerned about her lipid panel, as she is currently not on any medication.  Plan: -Continue aspirin 81 mg daily -Follow-up on lipid panel      Relevant Orders   Lipid Profile   Essential hypertension   BP Readings from Last 3 Encounters:  05/06/23 117/72  02/25/23 128/85  02/03/23 122/80   Patient is currently on amlodipine 5 mg, denies any dizziness, lightheadedness, chest pain, shortness of breath.  Blood pressure is controlled on this regimen.  Plan: -Continue amlodipine 5 mg daily      Hepatic hemangioma   Noted on the RUQ [place], GI following, no current interventions at this time.         Digestive   Drug-induced liver injury, Statin intolerance   Lab Results  Component Value Date   ALT 57 (H) 02/24/2023   AST 63 (H) 02/24/2023   GGT 1,297 (HH) 01/21/2023   ALKPHOS 940 (H) 02/24/2023   BILITOT 0.6 02/24/2023    Per the last visit on 09/03, she was stopped on amlodipine-atorvastatin combination due to adverse effect, she presented with fatigue and concern for brown urine. During this visit [09/03] labs were checked that showed significantly elevated alkaline phosphatase 2,093, GGT 1,297. Urgent referral to GI was placed and RUQ [place]. Autoimmune workup within normal range, Hepatitis panel normal. Extended lab work per GI unremarkable that included  normal alpha 1 antitrypsin, ANA, ceruloplasmin, IgM, IgG, IgA, ASMA, TTG, LKM1 Ab. Liver US showed hemangiomas, MR abdomen concurrent w/ hemangioma of the inferior right lobe of the liver.  Most recent liver function on 11/22 showed improving Alk Phos 321, AST 47, ALT 57.  Currently, she reports mild epigastric pain that started about a week ago, that comes and goes.  Denies any nausea or vomiting.  Denies any abnormal bowel movements.   Reports that the pain is tolerable, has no concern at this time.  Per the physical exam, no tenderness to palpation noted in the epigastric region.  Plan: - Will repeat liver function test today - Continue to follow up with GI, next appointment on 08/24/2023      Relevant Orders   Renal function panel     Other   Depression  Patient is currently on venlafaxine 75 mg daily, mood is currently stable with this regimen.       Return in about 3 months (around 08/04/2023) for Hypertension, liver function test.    Jeral Pinch, DO

## 2023-05-06 NOTE — Assessment & Plan Note (Signed)
BP Readings from Last 3 Encounters:  05/06/23 117/72  02/25/23 128/85  02/03/23 122/80   Patient is currently on amlodipine 5 mg, denies any dizziness, lightheadedness, chest pain, shortness of breath.  Blood pressure is controlled on this regimen.  Plan: -Continue amlodipine 5 mg daily

## 2023-05-06 NOTE — Assessment & Plan Note (Signed)
Patient is currently on venlafaxine 75 mg daily, mood is currently stable with this regimen.

## 2023-05-07 LAB — RENAL FUNCTION PANEL
Albumin: 4.3 g/dL (ref 3.9–4.9)
BUN/Creatinine Ratio: 15 (ref 12–28)
BUN: 13 mg/dL (ref 8–27)
CO2: 23 mmol/L (ref 20–29)
Calcium: 8.9 mg/dL (ref 8.7–10.3)
Chloride: 104 mmol/L (ref 96–106)
Creatinine, Ser: 0.84 mg/dL (ref 0.57–1.00)
Glucose: 75 mg/dL (ref 70–99)
Phosphorus: 3.9 mg/dL (ref 3.0–4.3)
Potassium: 4.1 mmol/L (ref 3.5–5.2)
Sodium: 142 mmol/L (ref 134–144)
eGFR: 75 mL/min/1.73

## 2023-05-07 LAB — LIPID PANEL
Chol/HDL Ratio: 3.4 ratio (ref 0.0–4.4)
Cholesterol, Total: 170 mg/dL (ref 100–199)
HDL: 50 mg/dL
LDL Chol Calc (NIH): 103 mg/dL — ABNORMAL HIGH (ref 0–99)
Triglycerides: 92 mg/dL (ref 0–149)
VLDL Cholesterol Cal: 17 mg/dL (ref 5–40)

## 2023-05-08 NOTE — Addendum Note (Signed)
Addended by: Jeral Pinch on: 05/08/2023 11:46 AM   Modules accepted: Orders

## 2023-05-11 LAB — HEPATIC FUNCTION PANEL
ALT: 37 [IU]/L — ABNORMAL HIGH (ref 0–32)
AST: 32 [IU]/L (ref 0–40)
Albumin: 4 g/dL (ref 3.9–4.9)
Alkaline Phosphatase: 239 [IU]/L — ABNORMAL HIGH (ref 44–121)
Bilirubin Total: 0.4 mg/dL (ref 0.0–1.2)
Bilirubin, Direct: 0.13 mg/dL (ref 0.00–0.40)
Total Protein: 6.5 g/dL (ref 6.0–8.5)

## 2023-05-11 LAB — SPECIMEN STATUS REPORT

## 2023-05-15 NOTE — Progress Notes (Signed)
Internal Medicine Clinic Attending  I was physically present during the key portions of the resident provided service and participated in the medical decision making of patient's management care. I reviewed pertinent patient test results.  The assessment, diagnosis, and plan were formulated together and I agree with the documentation in the resident's note.  Appt with cardiology in 05/2023 where lipid management options will be readdressed.  Miguel Aschoff, MD

## 2023-05-21 ENCOUNTER — Institutional Professional Consult (permissible substitution): Payer: Medicare Other | Admitting: Internal Medicine

## 2023-06-12 ENCOUNTER — Other Ambulatory Visit (HOSPITAL_COMMUNITY): Payer: Self-pay

## 2023-06-12 ENCOUNTER — Encounter (HOSPITAL_BASED_OUTPATIENT_CLINIC_OR_DEPARTMENT_OTHER): Payer: Self-pay | Admitting: Internal Medicine

## 2023-06-12 ENCOUNTER — Institutional Professional Consult (permissible substitution) (HOSPITAL_BASED_OUTPATIENT_CLINIC_OR_DEPARTMENT_OTHER): Payer: Medicare Other | Admitting: Internal Medicine

## 2023-06-12 ENCOUNTER — Ambulatory Visit (HOSPITAL_BASED_OUTPATIENT_CLINIC_OR_DEPARTMENT_OTHER): Payer: Medicare Other | Admitting: Internal Medicine

## 2023-06-12 ENCOUNTER — Telehealth: Payer: Self-pay | Admitting: Pharmacy Technician

## 2023-06-12 VITALS — BP 102/66 | HR 71 | Ht 67.0 in | Wt 148.0 lb

## 2023-06-12 DIAGNOSIS — T466X5A Adverse effect of antihyperlipidemic and antiarteriosclerotic drugs, initial encounter: Secondary | ICD-10-CM

## 2023-06-12 DIAGNOSIS — K719 Toxic liver disease, unspecified: Secondary | ICD-10-CM

## 2023-06-12 DIAGNOSIS — E785 Hyperlipidemia, unspecified: Secondary | ICD-10-CM | POA: Diagnosis not present

## 2023-06-12 DIAGNOSIS — T466X5D Adverse effect of antihyperlipidemic and antiarteriosclerotic drugs, subsequent encounter: Secondary | ICD-10-CM

## 2023-06-12 DIAGNOSIS — I700 Atherosclerosis of aorta: Secondary | ICD-10-CM | POA: Diagnosis not present

## 2023-06-12 MED ORDER — REPATHA SURECLICK 140 MG/ML ~~LOC~~ SOAJ: 140.0000 mg | SUBCUTANEOUS | 11 refills | Status: DC

## 2023-06-12 NOTE — Progress Notes (Unsigned)
LIPID CLINIC CONSULT NOTE  Chief Complaint:  Dyslipidemia  Primary Care Physician: Modena Slater, DO  Primary Cardiologist:  None  HPI:  Martha Spencer is a 73 y.o. female who is being seen today for the evaluation of dyslipidemia at the request of [name],*.  This is a pleasant 74 year old female kindly referred for evaluation management of dyslipidemia.  She has a history of high cholesterol and was on statin therapy but developed substantial hepatotoxicity about 4 months ago with an AST and ALT of 243 and 207, respectively and an alkaline phosphatase of 2093.  She was subsequently referred to [name], NP -with [facility].  It was felt that she had a statin hepatitis or hep a toxicity and after stopping the medication there was downward trend in her liver enzymes.  Most recently in December her AST and ALT were 32 and 37 with a minimally elevated alkaline phosphatase at 239.  She did have repeat lipid testing in December which showed total cholesterol 170, HDL 50, triglycerides 92 and LDL 103.  Her target LDL is less than 70.  PMHx:  Past Medical History:  Diagnosis Date   Anxiety    Depression    GERD (gastroesophageal reflux disease)    occ    Past Surgical History:  Procedure Laterality Date   ADENOIDECTOMY     COLONOSCOPY  2009   Dr Virginia Rochester -poor prep    EYE SURGERY     OOPHORECTOMY     WISDOM TOOTH EXTRACTION      FAMHx:  Family History  Problem Relation Age of Onset   Colon cancer Neg Hx    Colon polyps Neg Hx    Esophageal cancer Neg Hx    Rectal cancer Neg Hx    Stomach cancer Neg Hx     SOCHx:   reports that she has never smoked. She has never used smokeless tobacco. She reports that she does not drink alcohol and does not use drugs.  ALLERGIES:  Allergies  Allergen Reactions   Statins Other (See Comments)    Significant elevation in LFTs with liver injury   Sulfa Antibiotics     Unknown reaction   Wheat Other (See Comments)     Glutton Free    ROS: Pertinent items noted in HPI and remainder of comprehensive ROS otherwise negative.  HOME MEDS: Current Outpatient Medications on File Prior to Visit  Medication Sig Dispense Refill   amLODipine (NORVASC) 5 MG tablet Take 1 tablet (5 mg total) by mouth daily. 90 tablet 3   aspirin 81 MG chewable tablet Chew 1 tablet (81 mg total) by mouth daily. 90 tablet 3   clobetasol cream (TEMOVATE) 0.05 % APPLY SPARINGLY TO AFFECTED AREA TWICE A DAY  0   venlafaxine XR (EFFEXOR-XR) 75 MG 24 hr capsule TAKE 1 CAPSULE BY MOUTH EVERY DAY 30 capsule 1   No current facility-administered medications on file prior to visit.    LABS/IMAGING: No results found for this or any previous visit (from the past 48 hours). No results found.  LIPID PANEL:    Component Value Date/Time   CHOL 170 05/06/2023 0946   TRIG 92 05/06/2023 0946   HDL 50 05/06/2023 0946   CHOLHDL 3.4 05/06/2023 0946   CHOLHDL 4.4 12/07/2022 0345   VLDL 26 12/07/2022 0345   LDLCALC 103 (H) 05/06/2023 0946    WEIGHTS: Wt Readings from Last 3 Encounters:  06/12/23 148 lb (67.1 kg)  05/06/23 148 lb 6.4 oz (67.3  kg)  02/25/23 144 lb 8 oz (65.5 kg)    VITALS: BP 102/66   Pulse 71   Ht 5\' 7"  (1.702 m)   Wt 148 lb (67.1 kg)   SpO2 97%   BMI 23.18 kg/m   EXAM: Deferred  EKG: Deferred  ASSESSMENT: Statin hepatotoxicity Mixed dyslipidemia, goal LDL less than 70 Aortic atherosclerosis  PLAN: 1.   Martha Spencer had significant statin hepatotoxicity and this is in my opinion and an absolute contraindication to continuing statin therapy although there is some evidence that other statins might be tolerable.  Nonetheless I think that a better option for her would be a PCSK9 inhibitor.  Although she does not need a substantial lipid reduction, this would overall be better tolerated, not actively metabolized in the liver and generally has minimal if any effects on liver enzymes.  We certainly would recheck that  after starting therapy and if tolerated continue with that and plan to repeat lipids in a few months and follow-up afterwards.  Thanks again for the kind referral.  Chrystie Nose, MD, [facility]  [place]  [facility]  Medical Director of the Advanced Lipid Disorders &  Cardiovascular Risk Reduction Clinic Diplomate of the American Board of Clinical Lipidology Attending Cardiologist  Direct Dial: [phone]  Fax: [phone]  Website:  [email or web address]  Chrystie Nose 06/12/2023, 10:33 AM

## 2023-06-12 NOTE — Telephone Encounter (Signed)
Rx(s) sent to pharmacy electronically.

## 2023-06-12 NOTE — Patient Instructions (Signed)
Medication Instructions:  Dr. Rennis Golden recommends Repatha Sureclick 140mg /mL (PCSK9). This is an injectable cholesterol medication self-administered once every 14 days. This medication will likely need prior approval with your insurance company, which we will work on. If the medication is not approved initially, we may need to do an appeal with your insurance.   Administer medication in area of fatty tissue such as abdomen, outer thigh, back of upper arm - and rotate site with each injection Store medication in refrigerator until ready to administer - allow to sit at room temp for 30 mins - 1 hour prior to injection Dispose of medication in a SHARPS container - your pharmacy should be able to direct you on this and proper disposal   If you need a co-pay card for Repatha: Lawsponsor.fr If you need a co-pay card for Praluent: [email or web address]  Patient Assistance:    These foundations have funds at various times.   The PAN Foundation: https://www.panfoundation.org/disease-funds/hypercholesterolemia/ -- can sign up for wait list  The [facility] offers assistance to help pay for medication copays.  They will cover copays for all cholesterol lowering meds, including statins, fibrates, omega-3 fish oils like Vascepa, ezetimibe, Repatha, Praluent, Nexletol, Nexlizet.  The cards are usually good for $2,500 or 12 months, whichever comes first. Our fax # is [phone] (you will need this to apply) Go to healthwellfoundation.org Click on "Apply Now" Answer questions as to whom is applying (patient or representative) Your disease fund will be "hypercholesterolemia - Medicare access" They will ask questions about finances and which medications you are taking for cholesterol When you submit, the approval is usually within minutes.  You will need to print the card information from the site You will need to show this information to your pharmacy,  they will bill your Medicare Part D plan first -then bill Health Well --for the copay.   You can also call them at [phone], although the hold times can be quite long.     *If you need a refill on your cardiac medications before your next appointment, please call your pharmacy*   Lab Work: Non-Fasting liver enzyme test about a week after your 1st dose  Fasting lab work to check cholesterol in 3-4 months  ** complete about ONE WEEK before next appointment   If you have labs (blood work) drawn today and your tests are completely normal, you will receive your results only by: MyChart Message (if you have MyChart) OR A paper copy in the mail If you have any lab test that is abnormal or we need to change your treatment, we will call you to review the results.  Follow-Up: At [facility], you and your health needs are our priority.  As part of our continuing mission to provide you with exceptional heart care, we have created designated Provider Care Teams.  These Care Teams include your primary Cardiologist (physician) and Advanced Practice Providers (APPs -  Physician Assistants and Nurse Practitioners) who all work together to provide you with the care you need, when you need it.  We recommend signing up for the patient portal called "MyChart".  Sign up information is provided on this After Visit Summary.  MyChart is used to connect with patients for Virtual Visits (Telemedicine).  Patients are able to view lab/test results, encounter notes, upcoming appointments, etc.  Non-urgent messages can be sent to your provider as well.   To learn more about what you can do with MyChart, go to [email or web address].    Your next  appointment:    3-4 months with Dr. Rennis Golden - lipid clinic

## 2023-06-12 NOTE — Addendum Note (Signed)
Addended by: Lindell Spar on: 06/12/2023 02:45 PM   Modules accepted: Orders

## 2023-06-12 NOTE — Telephone Encounter (Signed)
Update sent to patient via MyChart

## 2023-06-12 NOTE — Telephone Encounter (Signed)
Pharmacy Patient Advocate Encounter  Received notification from [employer] that Prior Authorization for Repatha SureClick 140MG /ML auto-injectors has been APPROVED from 05/13/23 to 06/11/24. Ran test claim, Copay is $25.00. This test claim was processed through [facility]- copay amounts may vary at other pharmacies due to pharmacy/plan contracts, or as the patient moves through the different stages of their insurance plan.   PA #/Case ID/Reference #: [id number]

## 2023-06-24 LAB — HEPATIC FUNCTION PANEL
ALT: 44 [IU]/L — ABNORMAL HIGH (ref 0–32)
AST: 40 [IU]/L (ref 0–40)
Albumin: 4.6 g/dL (ref 3.9–4.9)
Alkaline Phosphatase: 202 [IU]/L — ABNORMAL HIGH (ref 44–121)
Bilirubin Total: 0.6 mg/dL (ref 0.0–1.2)
Bilirubin, Direct: 0.19 mg/dL (ref 0.00–0.40)
Total Protein: 6.6 g/dL (ref 6.0–8.5)

## 2023-06-26 ENCOUNTER — Encounter (HOSPITAL_BASED_OUTPATIENT_CLINIC_OR_DEPARTMENT_OTHER): Payer: Self-pay | Admitting: Internal Medicine

## 2023-06-29 NOTE — Telephone Encounter (Signed)
 Patient sent message in MyChart re: LFT results    Alkaline phosphatase improved at 202 - ALT mildly up at 44 (From 37 - not significant), but improved compared to 4 months ago. Would continue Repatha .   Dr Marvina Slough  Written by Hazle Lites, MD on 06/26/2023  6:07 PM EST Seen by patient Martha Spencer on 06/26/2023  6:26 PM

## 2023-06-29 NOTE — Addendum Note (Signed)
 Addended by: Francesco Inks on: 06/29/2023 02:06 PM   Modules accepted: Orders

## 2023-07-07 LAB — HEPATIC FUNCTION PANEL
ALT: 32 [IU]/L (ref 0–32)
AST: 31 [IU]/L (ref 0–40)
Albumin: 4.4 g/dL (ref 3.9–4.9)
Alkaline Phosphatase: 182 [IU]/L — ABNORMAL HIGH (ref 44–121)
Bilirubin Total: 0.5 mg/dL (ref 0.0–1.2)
Bilirubin, Direct: 0.2 mg/dL (ref 0.00–0.40)
Total Protein: 6.4 g/dL (ref 6.0–8.5)

## 2023-07-08 ENCOUNTER — Encounter: Payer: Self-pay | Admitting: Cardiovascular Disease

## 2023-07-09 ENCOUNTER — Other Ambulatory Visit: Payer: Self-pay | Admitting: *Deleted

## 2023-07-09 DIAGNOSIS — R748 Abnormal levels of other serum enzymes: Secondary | ICD-10-CM

## 2023-07-09 DIAGNOSIS — E785 Hyperlipidemia, unspecified: Secondary | ICD-10-CM

## 2023-07-09 DIAGNOSIS — K719 Toxic liver disease, unspecified: Secondary | ICD-10-CM

## 2023-08-04 ENCOUNTER — Ambulatory Visit (INDEPENDENT_AMBULATORY_CARE_PROVIDER_SITE_OTHER): Payer: Medicare Other | Admitting: Student

## 2023-08-04 VITALS — BP 131/70 | HR 68 | Temp 97.5°F | Ht 67.0 in | Wt 148.5 lb

## 2023-08-04 DIAGNOSIS — K719 Toxic liver disease, unspecified: Secondary | ICD-10-CM

## 2023-08-04 DIAGNOSIS — I10 Essential (primary) hypertension: Secondary | ICD-10-CM | POA: Diagnosis not present

## 2023-08-04 NOTE — Assessment & Plan Note (Signed)
 Current home regimen includes amlodipine 5 mg daily.  Blood pressure today in the office is 131/70.  She has not measured her blood pressure at home for 2 months.  Patient was counseled on recording her blood pressure and a log weekly and bring this to future appointments. - Continue amlodipine 5 mg

## 2023-08-04 NOTE — Assessment & Plan Note (Signed)
 Last seen in the clinic in 04/2023 for elevated LFTs secondary to her statin.  Her statin was discontinued.  She was recently started on Repatha every 14 days.  She endorses some fatigue but overall no signs or symptoms with this medication.  She denies any abdominal pain.  Her hepatic function panel on 07/07/2023 was notable for normal AST and ALT and a mildly elevated alk phos of 182.  She is scheduled to follow-up with GI on 08/24/2023. - Follow-up lipid panel, CBC - Continue Repatha - Follow-up with GI

## 2023-08-04 NOTE — Patient Instructions (Signed)
 Thank you so much for coming to the clinic today!   I will call back regarding your labs.  Please monitor your blood pressure at home and log this and bring to future appointments.   If you have any questions please feel free to the call the clinic at anytime at [phone]. It was a pleasure seeing you!  Best, Dr. Rayvon Char

## 2023-08-04 NOTE — Progress Notes (Signed)
 CC: Hypertension and elevated LFTs follow-up  HPI: Martha Spencer is a 73 y.o. female living with a history stated below and presents today for hypertension and elevated LFTs follow-up. Please see problem based assessment and plan for additional details.  Past Medical History:  Diagnosis Date   Anxiety    Depression    GERD (gastroesophageal reflux disease)    occ    Current Outpatient Medications on File Prior to Visit  Medication Sig Dispense Refill   amLODipine (NORVASC) 5 MG tablet Take 1 tablet (5 mg total) by mouth daily. 90 tablet 3   aspirin 81 MG chewable tablet Chew 1 tablet (81 mg total) by mouth daily. 90 tablet 3   clobetasol cream (TEMOVATE) 0.05 % APPLY SPARINGLY TO AFFECTED AREA TWICE A DAY  0   Evolocumab (REPATHA SURECLICK) 140 MG/ML SOAJ Inject 140 mg into the skin every 14 (fourteen) days. 2 mL 11   venlafaxine XR (EFFEXOR-XR) 75 MG 24 hr capsule TAKE 1 CAPSULE BY MOUTH EVERY DAY 30 capsule 1   No current facility-administered medications on file prior to visit.    Family History  Problem Relation Age of Onset   Colon cancer Neg Hx    Colon polyps Neg Hx    Esophageal cancer Neg Hx    Rectal cancer Neg Hx    Stomach cancer Neg Hx     Social History   Socioeconomic History   Marital status: Divorced    Spouse name: Not on file   Number of children: 0   Years of education: Not on file   Highest education level: Not on file  Occupational History   Occupation: Warden/ranger  Tobacco Use   Smoking status: Never   Smokeless tobacco: Never  Vaping Use   Vaping status: Never Used  Substance and Sexual Activity   Alcohol use: No   Drug use: No   Sexual activity: Not on file  Other Topics Concern   Not on file  Social History Narrative   Not on file   Social Drivers of Health   Financial Resource Strain: Low Risk  (01/20/2023)   Overall Financial Resource Strain (CARDIA)    Difficulty of Paying Living Expenses: Not hard at all  Food Insecurity:  Low Risk  (03/09/2023)   Received from [facility]   Hunger Vital Sign    Worried About Running Out of Food in the Last Year: Never true    Ran Out of Food in the Last Year: Never true  Transportation Needs: No Transportation Needs (03/09/2023)   Received from [facility]    In the past 12 months, has lack of reliable transportation kept you from medical appointments, meetings, work or from getting things needed for daily living? : No  Physical Activity: Insufficiently Active (01/20/2023)   Exercise Vital Sign    Days of Exercise per Week: 1 day    Minutes of Exercise per Session: 20 min  Stress: No Stress Concern Present (01/20/2023)   [name]    Feeling of Stress : Only a little  Social Connections: Moderately Isolated (01/20/2023)   Social Connection and Isolation Panel [NHANES]    Frequency of Communication with Friends and Family: More than three times a week    Frequency of Social Gatherings with Friends and Family: Three times a week    Attends Religious Services: 1 to 4 times per year    Active Member of Clubs or  Organizations: No    Attends Banker Meetings: Never    Marital Status: Divorced  Catering manager Violence: Not At Risk (01/20/2023)   Humiliation, Afraid, Rape, and Kick questionnaire    Fear of Current or Ex-Partner: No    Emotionally Abused: No    Physically Abused: No    Sexually Abused: No    Review of Systems: ROS negative except for what is noted on the assessment and plan.  Vitals:   08/04/23 0950  BP: 131/70  Pulse: 68  Temp: (!) 97.5 F (36.4 C)  TempSrc: Oral  SpO2: 100%  Weight: 148 lb 8 oz (67.4 kg)  Height: 5\' 7"  (1.702 m)    Physical Exam: Constitutional: well-appearing in no acute distress HENT: normocephalic atraumatic, mucous membranes moist Eyes: conjunctiva non-erythematous Neck: supple Cardiovascular: regular rate and rhythm, no  m/r/g Pulmonary/Chest: normal work of breathing on room air, lungs clear to auscultation bilaterally Abdominal: soft, non-tender, non-distended MSK: normal bulk and tone Neurological: alert & oriented x 3, 5/5 strength in bilateral upper and lower extremities, normal gait Skin: warm and dry  Assessment & Plan:   Essential hypertension Current home regimen includes amlodipine 5 mg daily.  Blood pressure today in the office is 131/70.  She has not measured her blood pressure at home for 2 months.  Patient was counseled on recording her blood pressure and a log weekly and bring this to future appointments. - Continue amlodipine 5 mg  Drug-induced liver injury, Statin intolerance Last seen in the clinic in 04/2023 for elevated LFTs secondary to her statin.  Her statin was discontinued.  She was recently started on Repatha every 14 days.  She endorses some fatigue but overall no signs or symptoms with this medication.  She denies any abdominal pain.  Her hepatic function panel on 07/07/2023 was notable for normal AST and ALT and a mildly elevated alk phos of 182.  She is scheduled to follow-up with GI on 08/24/2023. - Follow-up lipid panel, CBC - Continue Repatha - Follow-up with GI  Patient discussed with Dr. Bary Leriche, MD  [facility] Internal Medicine, PGY-1 Date 08/04/2023 Time 11:19 AM

## 2023-08-05 ENCOUNTER — Encounter: Payer: Self-pay | Admitting: Student

## 2023-08-05 LAB — CBC
Hematocrit: 39.8 % (ref 34.0–46.6)
Hemoglobin: 13.2 g/dL (ref 11.1–15.9)
MCH: 31.3 pg (ref 26.6–33.0)
MCHC: 33.2 g/dL (ref 31.5–35.7)
MCV: 94 fL (ref 79–97)
Platelets: 220 10*3/uL (ref 150–450)
RBC: 4.22 x10E6/uL (ref 3.77–5.28)
RDW: 12.1 % (ref 11.7–15.4)
WBC: 5.3 10*3/uL (ref 3.4–10.8)

## 2023-08-05 LAB — LIPID PANEL
Chol/HDL Ratio: 2.5 ratio (ref 0.0–4.4)
Cholesterol, Total: 130 mg/dL (ref 100–199)
HDL: 52 mg/dL (ref >39)
LDL Chol Calc (NIH): 60 mg/dL (ref 0–99)
Triglycerides: 96 mg/dL (ref 0–149)
VLDL Cholesterol Cal: 18 mg/dL (ref 5–40)

## 2023-08-09 ENCOUNTER — Encounter: Payer: Self-pay | Admitting: Student

## 2023-08-10 ENCOUNTER — Telehealth: Payer: Self-pay | Admitting: Internal Medicine

## 2023-08-10 NOTE — Telephone Encounter (Signed)
 Although flu like reactions and runny nose are possible side effects of medication, it is also possible that she has a virus that we cannot test for. If she is due for another shot, ok to hold off a few days until she feels better, but I do think retrial with medication is warranted as there is a good chance its not from medication.

## 2023-08-10 NOTE — Telephone Encounter (Signed)
 Pt c/o medication issue:  1. Name of Medication: Evolocumab (REPATHA SURECLICK) 140 MG/ML SOAJ   2. How are you currently taking this medication (dosage and times per day)? As written  3. Are you having a reaction (difficulty breathing--STAT)? No   4. What is your medication issue? Flu like symptoms

## 2023-08-10 NOTE — Telephone Encounter (Signed)
 Patient identification verified by 2 forms. Marilynn Rail, RN    Called and spoke to patient  Patient states:   -tested negative for covid and flu   -congestion, runny nose, coughing, general fatigue   -symptoms on going since last Wednesday   -believes symptoms are related to Repatha   -started Repatha at the end of February   -She did not pick up Repatha in January  Patient denies:   -fever  Informed patient:  -message sent to pharmacy for input  -flu like symptom can be a side effect  Patient verbalized understanding, no further questions at this time

## 2023-08-10 NOTE — Telephone Encounter (Signed)
 Attempted to call patient, no answer left detailed message relaying message below.

## 2023-08-11 NOTE — Telephone Encounter (Signed)
 Patient identification verified by 2 forms. Marilynn Rail, RN    Called and spoke to patient  Relayed pharmacy message below  Advised patient to follow up with PCP if symptoms persist  Patient verbalize understanding, no questions at this time

## 2023-08-12 NOTE — Progress Notes (Signed)
 Internal Medicine Clinic Attending  Case discussed with the resident at the time of the visit.  We reviewed the resident's history and exam and pertinent patient test results.  I agree with the assessment, diagnosis, and plan of care documented in the resident's note.

## 2023-08-14 ENCOUNTER — Other Ambulatory Visit: Payer: Self-pay | Admitting: Student

## 2023-08-14 NOTE — Telephone Encounter (Signed)
 Medication sent to pharmacy

## 2023-10-03 LAB — NMR, LIPOPROFILE
Cholesterol, Total: 122 mg/dL (ref 100–199)
HDL Particle Number: 29.1 umol/L — ABNORMAL LOW (ref >=30.5)
HDL-C: 58 mg/dL (ref >39)
LDL Particle Number: 486 nmol/L (ref <1000)
LDL Size: 20.1 nm — ABNORMAL LOW (ref >20.5)
LDL-C (NIH Calc): 50 mg/dL (ref 0–99)
LP-IR Score: 35 (ref <=45)
Small LDL Particle Number: 275 nmol/L (ref <=527)
Triglycerides: 66 mg/dL (ref 0–149)

## 2023-10-03 LAB — HEPATIC FUNCTION PANEL
ALT: 28 IU/L (ref 0–32)
AST: 27 IU/L (ref 0–40)
Albumin: 4.2 g/dL (ref 3.9–4.9)
Alkaline Phosphatase: 161 IU/L — ABNORMAL HIGH (ref 44–121)
Bilirubin Total: 0.6 mg/dL (ref 0.0–1.2)
Bilirubin, Direct: 0.23 mg/dL (ref 0.00–0.40)
Total Protein: 6.4 g/dL (ref 6.0–8.5)

## 2023-10-03 LAB — LIPOPROTEIN A (LPA): Lipoprotein (a): <8.4 nmol/L (ref <75.0)

## 2023-10-05 ENCOUNTER — Ambulatory Visit: Payer: Self-pay | Admitting: Internal Medicine

## 2023-10-09 ENCOUNTER — Ambulatory Visit (HOSPITAL_BASED_OUTPATIENT_CLINIC_OR_DEPARTMENT_OTHER): Payer: Medicare Other | Admitting: Internal Medicine

## 2023-10-09 VITALS — BP 116/68 | HR 64 | Ht 67.0 in | Wt 150.7 lb

## 2023-10-09 DIAGNOSIS — E785 Hyperlipidemia, unspecified: Secondary | ICD-10-CM

## 2023-10-09 DIAGNOSIS — T466X5D Adverse effect of antihyperlipidemic and antiarteriosclerotic drugs, subsequent encounter: Secondary | ICD-10-CM

## 2023-10-09 DIAGNOSIS — R748 Abnormal levels of other serum enzymes: Secondary | ICD-10-CM

## 2023-10-09 DIAGNOSIS — I700 Atherosclerosis of aorta: Secondary | ICD-10-CM

## 2023-10-09 DIAGNOSIS — K719 Toxic liver disease, unspecified: Secondary | ICD-10-CM

## 2023-10-09 NOTE — Patient Instructions (Signed)
 Medication Instructions:  NO CHANGES  *If you need a refill on your cardiac medications before your next appointment, please call your pharmacy*  Lab Work: FASTING lab work in 1 year  ** complete one week before next visit  If you have labs (blood work) drawn today and your tests are completely normal, you will receive your results only by: MyChart Message (if you have MyChart) OR A paper copy in the mail If you have any lab test that is abnormal or we need to change your treatment, we will call you to review the results.  Follow-Up: At [facility], you and your health needs are our priority.  As part of our continuing mission to provide you with exceptional heart care, our providers are all part of one team.  This team includes your primary Cardiologist (physician) and Advanced Practice Providers or APPs (Physician Assistants and Nurse Practitioners) who all work together to provide you with the care you need, when you need it.  Your next appointment:    12 months with Slater Duncan, NP -- lipid clinic  We recommend signing up for the patient portal called "MyChart".  Sign up information is provided on this After Visit Summary.  MyChart is used to connect with patients for Virtual Visits (Telemedicine).  Patients are able to view lab/test results, encounter notes, upcoming appointments, etc.  Non-urgent messages can be sent to your provider as well.   To learn more about what you can do with MyChart, go to [email or web address].   Other Instructions  HAPPY BIRTHDAY!

## 2023-10-09 NOTE — Progress Notes (Signed)
 LIPID CLINIC CONSULT NOTE  Chief Complaint:  Follow-up dyslipidemia  Primary Care Physician: Martha Neri, DO  Primary Cardiologist:  None  HPI:  Martha Spencer is a 73 y.o. female who is being seen today for the evaluation of dyslipidemia at the request of Martha Neri, DO.  This is a pleasant 73 year old female kindly referred for evaluation management of dyslipidemia.  She has a history of high cholesterol and was on statin therapy but developed substantial hepatotoxicity about 4 months ago with an AST and ALT of 243 and 207, respectively and an alkaline phosphatase of 2093.  She was subsequently referred to Martha Ghent, NP -with [facility].  It was felt that she had a statin hepatitis or hep a toxicity and after stopping the medication there was downward trend in her liver enzymes.  Most recently in December her AST and ALT were 32 and 37 with a minimally elevated alkaline phosphatase at 239.  She did have repeat lipid testing in December which showed total cholesterol 170, HDL 50, triglycerides 92 and LDL 103.  Her target LDL is less than 70.  10/09/2023  Martha Spencer is seen today for follow-up.  She has done well on Repatha  after adding this to her therapy regimen.  Fortunately it has not caused any significant abnormalities to her liver enzymes.  In fact her AST and ALT are now normal at 27 and 28 with an improving alkaline phosphatase at 161, previously as high as 1888.  She does not report any side effect such as myalgias with the medication.  Overall she has had marked reduction in her lipids.  Her LDL particle number is 46 with an LDL of 50, HDL 58 and triglycerides 66.  LP(a) was tested and is negative.  She is also made significant dietary improvements as well.  PMHx:  Past Medical History:  Diagnosis Date   Anxiety    Depression    GERD (gastroesophageal reflux disease)    occ    Past Surgical History:  Procedure Laterality Date   ADENOIDECTOMY     COLONOSCOPY  2009    Dr Alethia Huxley -poor prep    EYE SURGERY     OOPHORECTOMY     WISDOM TOOTH EXTRACTION      FAMHx:  Family History  Problem Relation Age of Onset   Colon cancer Neg Hx    Colon polyps Neg Hx    Esophageal cancer Neg Hx    Rectal cancer Neg Hx    Stomach cancer Neg Hx     SOCHx:   reports that she has never smoked. She has never used smokeless tobacco. She reports that she does not drink alcohol and does not use drugs.  ALLERGIES:  Allergies  Allergen Reactions   Statins Other (See Comments)    Significant elevation in LFTs with liver injury   Sulfa Antibiotics     Unknown reaction   Wheat Other (See Comments)    Glutton Free    ROS: Pertinent items noted in HPI and remainder of comprehensive ROS otherwise negative.  HOME MEDS: Current Outpatient Medications on File Prior to Visit  Medication Sig Dispense Refill   amLODipine  (NORVASC ) 5 MG tablet Take 1 tablet (5 mg total) by mouth daily. 90 tablet 3   aspirin  81 MG chewable tablet Chew 1 tablet (81 mg total) by mouth daily. 90 tablet 3   clobetasol cream (TEMOVATE) 0.05 % APPLY SPARINGLY TO AFFECTED AREA TWICE A DAY  0   Evolocumab  (REPATHA  SURECLICK)  140 MG/ML SOAJ Inject 140 mg into the skin every 14 (fourteen) days. 2 mL 11   venlafaxine  XR (EFFEXOR -XR) 75 MG 24 hr capsule TAKE 1 CAPSULE BY MOUTH EVERY DAY 30 capsule 1   No current facility-administered medications on file prior to visit.    LABS/IMAGING: No results found for this or any previous visit (from the past 48 hours). No results found.  LIPID PANEL:    Component Value Date/Time   CHOL 130 08/04/2023 1032   TRIG 96 08/04/2023 1032   HDL 52 08/04/2023 1032   CHOLHDL 2.5 08/04/2023 1032   CHOLHDL 4.4 12/07/2022 0345   VLDL 26 12/07/2022 0345   LDLCALC 60 08/04/2023 1032    WEIGHTS: Wt Readings from Last 3 Encounters:  10/09/23 150 lb 11.2 oz (68.4 kg)  08/04/23 148 lb 8 oz (67.4 kg)  06/12/23 148 lb (67.1 kg)    VITALS: BP 116/68   Pulse 64   Ht  5\' 7"  (1.702 m)   Wt 150 lb 11.2 oz (68.4 kg)   SpO2 97%   BMI 23.60 kg/m   EXAM: Deferred  EKG: Deferred  ASSESSMENT: Statin hepatotoxicity Mixed dyslipidemia, goal LDL less than 70 Aortic atherosclerosis Negative LP(a)  PLAN: 1.   Martha Spencer has had a substantial reduction in her lipids on Repatha  and is tolerating the medication well.  She is now at goal.  Her liver enzymes are normal at this point including mildly elevated alkaline phosphatase however orders of magnitude lower than it had been in the past.  Will plan to continue her current therapy.  She can follow-up with us  annually or sooner as necessary for reauthorization of medication.  Martha Spencer, Martha Spencer, Martha Spencer, FNLA, FACP  Martha  [facility]  Medical Director of the [facility]  [facility] Diplomate of the American Board of Clinical Lipidology Attending Cardiologist  Direct Dial: [phone]  Fax: [phone]  Website:  [email or web address] 10/09/2023, 11:21 AM

## 2023-10-16 ENCOUNTER — Encounter: Payer: Self-pay | Admitting: Family Medicine

## 2023-10-16 ENCOUNTER — Other Ambulatory Visit: Payer: Self-pay | Admitting: Student

## 2023-10-16 ENCOUNTER — Ambulatory Visit: Admitting: Family Medicine

## 2023-10-16 VITALS — BP 106/70 | HR 74 | Temp 98.1°F | Resp 16 | Ht 66.9 in | Wt 151.4 lb

## 2023-10-16 DIAGNOSIS — R7989 Other specified abnormal findings of blood chemistry: Secondary | ICD-10-CM

## 2023-10-16 DIAGNOSIS — E785 Hyperlipidemia, unspecified: Secondary | ICD-10-CM | POA: Diagnosis not present

## 2023-10-16 DIAGNOSIS — F3341 Major depressive disorder, recurrent, in partial remission: Secondary | ICD-10-CM

## 2023-10-16 DIAGNOSIS — I10 Essential (primary) hypertension: Secondary | ICD-10-CM

## 2023-10-16 MED ORDER — VENLAFAXINE HCL ER 75 MG PO CP24: 75.0000 mg | ORAL_CAPSULE | Freq: Every day | ORAL | 3 refills | Status: AC

## 2023-10-16 MED ORDER — AMLODIPINE BESYLATE 5 MG PO TABS: 5.0000 mg | ORAL_TABLET | Freq: Every day | ORAL | 3 refills | Status: AC

## 2023-10-16 NOTE — Assessment & Plan Note (Signed)
 Problem has been stable for years. Continue Effexor  XR 75 mg daily.

## 2023-10-16 NOTE — Progress Notes (Signed)
 HPI: [name] is a 74 y.o. female with a PMHx signifiicant for hepatic hemangioma, HTN, CVA, chronic constipation, and depression, who is here today to establish care.  Former PCP: Jonelle Neri, DO Last preventive routine visit: not on file  Exercise: Patient admits she does not exercise but is active at work.  Sleep: ~7 hours per night on average. She says she often has trouble falling asleep, but once asleep, she can sleep through the night.  Alcohol Use: none Smoking: none Vision: UTD on routine vision care.  Dental: UTD on routine dental care.  Follows with gynecology.   Chronic medical problems:   Hypertension:  Medications: Currently on amlodipine  5 mg daily.  Side effects: Mild LE edema. Checking BP at home: She does not check her BP regularly at home.  She follows with cardiology annually, most recently on 5/23.  Negative for unusual or severe headache, visual changes, exertional chest pain, dyspnea, palpitations, or focal weakness.  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 Occasional speech difficulty , usually when she is very fatigued.   HLD: Currently on Repatha  140 mg every 2 weeks and aspirin  81 mg daily.  She had tried rosuvastatin but it caused hepatotoxicity. Lab Results  Component Value Date   ALT 28 10/02/2023   AST 27 10/02/2023   GGT 1,297 (HH) 01/21/2023   ALKPHOS 161 (H) 10/02/2023   BILITOT 0.6 10/02/2023      Latest Ref Rng & Units 10/02/2023   10:29 AM 07/07/2023   10:50 AM 06/23/2023    3:17 PM  Hepatic Function  Total Protein 6.0 - 8.5 g/dL 6.4  6.4  6.6   Albumin 3.9 - 4.9 g/dL 4.2  4.4  4.6   AST 0 - 40 IU/L 27  31  40   ALT 0 - 32 IU/L 28  32  44   Alk Phosphatase 44 - 121 IU/L 161  182  202   Total Bilirubin 0.0 - 1.2 mg/dL  0.6  0.5  0.6   Bilirubin, Direct 0.00 - 0.40 mg/dL 1.61  0.96  0.45    Depression: dx'ed 20-30 years ago Currently on Effexor  XR 75 mg daily. She used to see a psychiatrist but is not currently.   Reports symptoms as otherwise stable.  Osteopenia:  She has her bone density checked every 2 years through her gynecologist's office.   She mentions she had a fall last year, she tripped one time while walking stairs and carrying her instrument.   Review of Systems  Constitutional:  Negative for activity change, appetite change and fever.  HENT:  Negative for sore throat and trouble swallowing.   Respiratory:  Negative for cough and wheezing.   Gastrointestinal:  Negative for abdominal pain, nausea and vomiting.  Endocrine: Negative for cold intolerance and heat intolerance.  Genitourinary:  Negative for decreased urine volume, dysuria and hematuria.  Skin:  Negative for rash.  Neurological:  Negative for syncope and facial asymmetry.  Psychiatric/Behavioral:  Negative for confusion and hallucinations.   See other pertinent positives and negatives in HPI.  Current Outpatient Medications on File Prior to Visit  Medication Sig Dispense Refill   aspirin  81 MG chewable tablet Chew 1 tablet (81 mg total) by  mouth daily. 90 tablet 3   clobetasol cream (TEMOVATE) 0.05 % APPLY SPARINGLY TO AFFECTED AREA TWICE A DAY  0   Evolocumab  (REPATHA  SURECLICK) 140 MG/ML SOAJ Inject 140 mg into the skin every 14 (fourteen) days. 2 mL 11   No current facility-administered medications on file prior to visit.   Past Medical History:  Diagnosis Date   Anxiety    Depression    GERD (gastroesophageal reflux disease)    occ   Allergies  Allergen Reactions   Statins Other (See Comments)    Significant elevation in LFTs with liver injury   Sulfa Antibiotics     Unknown reaction   Wheat Other (See Comments)    Glutton Free   Family History  Problem Relation Age of Onset   Hypertension Mother     Diabetes Mother    Thyroid disease Mother    Prostate cancer Brother    Colon cancer Neg Hx    Colon polyps Neg Hx    Esophageal cancer Neg Hx    Rectal cancer Neg Hx    Stomach cancer Neg Hx    Social History   Socioeconomic History   Marital status: Divorced    Spouse name: Not on file   Number of children: 0   Years of education: Not on file   Highest education level: Not on file  Occupational History   Occupation: Warden/ranger  Tobacco Use   Smoking status: Never   Smokeless tobacco: Never  Vaping Use   Vaping status: Never Used  Substance and Sexual Activity   Alcohol use: No   Drug use: No   Sexual activity: Not on file  Other Topics Concern   Not on file  Social History Narrative   Not on file   Social Drivers of Health   Financial Resource Strain: Low Risk  (01/20/2023)   Overall Financial Resource Strain (CARDIA)    Difficulty of Paying Living Expenses: Not hard at all  Food Insecurity: Low Risk  (03/09/2023)   Received from [facility]   Hunger Vital Sign    Worried About Running Out of Food in the Last Year: Never true    Ran Out of Food in the Last Year: Never true  Transportation Needs: No Transportation Needs (03/09/2023)   Received from [facility]    In the past 12 months, has lack of reliable transportation kept you from medical appointments, meetings, work or from getting things needed for daily living? : No  Physical Activity: Insufficiently Active (01/20/2023)   Exercise Vital Sign    Days of Exercise per Week: 1 day    Minutes of Exercise per Session: 20 min  Stress: No Stress Concern Present (01/20/2023)   [name]    Feeling of Stress : Only a little  Social Connections: Moderately Isolated (01/20/2023)   Social Connection and Isolation Panel [NHANES]    Frequency of Communication with Friends and Family: More than three times a week    Frequency of Social  Gatherings with Friends and Family: Three times a week    Attends Religious Services: 1 to 4 times per year    Active Member of Clubs or Organizations: No    Attends Banker Meetings: Never    Marital Status: Divorced   Vitals:   10/16/23 1014  BP: 106/70  Pulse: 74  Resp: 16  Temp: 98.1 F (36.7 C)  SpO2: 97%   Body  mass index is 23.78 kg/m.  Physical Exam Vitals and nursing note reviewed.  Constitutional:      General: She is not in acute distress.    Appearance: She is well-developed.  HENT:     Head: Normocephalic and atraumatic.     Mouth/Throat:     Mouth: Mucous membranes are moist.     Pharynx: Oropharynx is clear. Uvula midline.  Eyes:     Conjunctiva/sclera: Conjunctivae normal.  Neck:     Thyroid: No thyroid mass or thyromegaly.  Cardiovascular:     Rate and Rhythm: Normal rate and regular rhythm.     Pulses:          Dorsalis pedis pulses are 2+ on the right side and 2+ on the left side.     Heart sounds: No murmur heard. Pulmonary:     Effort: Pulmonary effort is normal. No respiratory distress.     Breath sounds: Normal breath sounds.  Abdominal:     Palpations: Abdomen is soft. There is no hepatomegaly or mass.     Tenderness: There is no abdominal tenderness.  Musculoskeletal:     Right lower leg: No edema.     Left lower leg: No edema.  Lymphadenopathy:     Cervical: No cervical adenopathy.  Skin:    General: Skin is warm.     Findings: No erythema or rash.  Neurological:     General: No focal deficit present.     Mental Status: She is alert and oriented to person, place, and time.     Cranial Nerves: No cranial nerve deficit.     Gait: Gait normal.  Psychiatric:        Mood and Affect: Mood and affect normal.   ASSESSMENT AND PLAN:  Ms. Rowton was seen today to establish care.   Hyperlipidemia, unspecified hyperlipidemia type Assessment & Plan: LDL goal < 70. Statin medication caused liver abnormalities. Continue Repatha   140 mg every 2 weeks. Following with cardiologist.   Abnormal LFTs Assessment & Plan: Stable. Attributed to statin medication, Rosuvastatin. Liver US  01/2023:Hyperechoic liver lesions, hemangiomas. Normal sonographic appearance of the gallbladder. Will continue following regularly.   Essential hypertension Assessment & Plan: SBP in the low 100's, recommend monitoring BP regularly at home. Continue amlodipine  5 mg daily, she can try to take it at bedtime to see if that helps with lower extremity edema. Continue low-salt diet. Eye exam is current. Follow-up in 6 months.  Orders: -     amLODIPine  Besylate; Take 1 tablet (5 mg total) by mouth daily.  Dispense: 90 tablet; Refill: 3  Recurrent major depressive disorder, in partial remission [facility]) Assessment & Plan: Problem has been stable for years. Continue Effexor  XR 75 mg daily.  Orders: -     Venlafaxine  HCl ER; Take 1 capsule (75 mg total) by mouth daily.  Dispense: 90 capsule; Refill: 3   Return in about 6 months (around 04/17/2024) for CPE.  I, Odelia Bender, acting as a scribe for Kelcey Korus [place], MD., have documented all relevant documentation on the behalf of Laura-Lee Villegas [place], MD, as directed by  Shavell Nored [place], MD while in the presence of Kynzleigh Bandel [place], MD.   I, Marne Meline [place], MD, have reviewed all documentation for this visit. The documentation on 10/16/23 for the exam, diagnosis, procedures, and orders are all accurate and complete.  Shalon Councilman G. [place], MD  [facility]. [place] office.

## 2023-10-16 NOTE — Assessment & Plan Note (Addendum)
 LDL goal < 70. Statin medication caused liver abnormalities. Continue Repatha  140 mg every 2 weeks. Following with cardiologist.

## 2023-10-16 NOTE — Patient Instructions (Addendum)
 A few things to remember from today's visit:  Essential hypertension  Abnormal LFTs  Hyperlipidemia, unspecified hyperlipidemia type No changes today. Will see you back in 6 months then annually.  If you need refills for medications you take chronically, please call your pharmacy. Do not use My Chart to request refills or for acute issues that need immediate attention. If you send a my chart message, it may take a few days to be addressed, specially if I am not in the office.  Please be sure medication list is accurate. If a new problem present, please set up appointment sooner than planned today.

## 2023-10-16 NOTE — Assessment & Plan Note (Signed)
 SBP in the low 100's, recommend monitoring BP regularly at home. Continue amlodipine  5 mg daily, she can try to take it at bedtime to see if that helps with lower extremity edema. Continue low-salt diet. Eye exam is current. Follow-up in 6 months.

## 2023-10-16 NOTE — Assessment & Plan Note (Addendum)
 Stable. Attributed to statin medication, Rosuvastatin. Liver US  01/2023:Hyperechoic liver lesions, hemangiomas. Normal sonographic appearance of the gallbladder. Will continue following regularly.

## 2023-10-16 NOTE — Telephone Encounter (Signed)
 Medication sent to pharmacy

## 2023-11-02 ENCOUNTER — Encounter: Payer: Self-pay | Admitting: Family Medicine

## 2023-11-06 ENCOUNTER — Telehealth: Payer: Self-pay

## 2023-11-06 NOTE — Telephone Encounter (Signed)
 I spoke with pt, advised her to check with health department. Pt verbalized understanding.

## 2023-11-06 NOTE — Telephone Encounter (Signed)
 Copied from CRM [id number]. Topic: General - Other >> Nov 06, 2023  3:04 PM Kita Perish H wrote: Reason for CRM: Patient is calling to have COVID vaccine, agent advised patient clinic doesn't offer vaccine, patient states her pharmacy doesn't offer them as well as she tried to reach out to insurance to see where she can go and hasn't been able to find out. Wants to know if the clinic knows anywhere she can have it done at.  [place] [phone]

## 2024-02-17 ENCOUNTER — Other Ambulatory Visit: Payer: Self-pay | Admitting: Student

## 2024-02-19 ENCOUNTER — Other Ambulatory Visit: Payer: Self-pay | Admitting: Family Medicine

## 2024-02-19 MED ORDER — ASPIRIN 81 MG PO CHEW: 81.0000 mg | CHEWABLE_TABLET | Freq: Every day | ORAL | 3 refills | Status: AC

## 2024-02-19 NOTE — Telephone Encounter (Signed)
 Copied from CRM [id number]. Topic: Clinical - Medication Refill >> Feb 19, 2024 11:17 AM Thersia C wrote: Medication: aspirin  81 MG chewable tablet  Has the patient contacted their pharmacy? Yes (Agent: If no, request that the patient contact the pharmacy for the refill. If patient does not wish to contact the pharmacy document the reason why and proceed with request.) (Agent: If yes, when and what did the pharmacy advise?)  This is the patient's preferred pharmacy:  [id number] [address] [facility] - [address] [place] [address] Phone: [phone] Fax: [phone]   Is this the correct pharmacy for this prescription? Yes If no, delete pharmacy and type the correct one.   Has the prescription been filled recently? No  Is the patient out of the medication? Yes  Has the patient been seen for an appointment in the last year OR does the patient have an upcoming appointment? Yes  Can we respond through MyChart? Yes  Agent: Please be advised that Rx refills may take up to 3 business days. We ask that you follow-up with your pharmacy.

## 2024-03-17 ENCOUNTER — Ambulatory Visit: Admitting: Family Medicine

## 2024-04-11 ENCOUNTER — Ambulatory Visit

## 2024-04-11 VITALS — Ht 66.9 in | Wt 151.0 lb

## 2024-04-11 DIAGNOSIS — Z0000 Encounter for general adult medical examination without abnormal findings: Secondary | ICD-10-CM

## 2024-04-11 NOTE — Progress Notes (Signed)
 Chief Complaint  Patient presents with   Medicare Wellness     Subjective:   Martha Spencer is a 73 y.o. female who presents for a Medicare Annual Wellness Visit.  Allergies (verified) Statins, Sulfa antibiotics, and Wheat   History: Past Medical History:  Diagnosis Date   Anxiety    Depression    GERD (gastroesophageal reflux disease)    occ   Past Surgical History:  Procedure Laterality Date   ADENOIDECTOMY     COLONOSCOPY  2009   Dr Geroge -poor prep    EYE SURGERY     OOPHORECTOMY     WISDOM TOOTH EXTRACTION     Family History  Problem Relation Age of Onset   Hypertension Mother    Diabetes Mother    Thyroid disease Mother    Prostate cancer Brother    Colon cancer Neg Hx    Colon polyps Neg Hx    Esophageal cancer Neg Hx    Rectal cancer Neg Hx    Stomach cancer Neg Hx    Social History   Occupational History   Occupation: warden/ranger  Tobacco Use   Smoking status: Never   Smokeless tobacco: Never  Vaping Use   Vaping status: Never Used  Substance and Sexual Activity   Alcohol use: No   Drug use: No   Sexual activity: Not on file   Tobacco Counseling Counseling given: Not Answered  SDOH Screenings   Food Insecurity: No Food Insecurity (04/11/2024)  Housing: Unknown (04/11/2024)  Transportation Needs: No Transportation Needs (04/11/2024)  Utilities: Not At Risk (04/11/2024)  Alcohol Screen: Low Risk  (01/20/2023)  Depression (PHQ2-9): Low Risk  (04/11/2024)  Financial Resource Strain: Low Risk  (01/20/2023)  Physical Activity: Inactive (04/11/2024)  Social Connections: Socially Isolated (04/11/2024)  Stress: No Stress Concern Present (04/11/2024)  Tobacco Use: Low Risk  (04/11/2024)  Health Literacy: Adequate Health Literacy (04/11/2024)   See flowsheets for full screening details  Depression Screen PHQ 2 & 9 Depression Scale- Over the past 2 weeks, how often have you been bothered by any of the following problems? Little interest or  pleasure in doing things: 0 Feeling down, depressed, or hopeless (PHQ Adolescent also includes...irritable): 0 PHQ-2 Total Score: 0 Trouble falling or staying asleep, or sleeping too much: 1 (falling asleep) Feeling tired or having little energy: 0 Poor appetite or overeating (PHQ Adolescent also includes...weight loss): 0 Feeling bad about yourself - or that you are a failure or have let yourself or your family down: 0 Trouble concentrating on things, such as reading the newspaper or watching television (PHQ Adolescent also includes...like school work): 0 Moving or speaking so slowly that other people could have noticed. Or the opposite - being so fidgety or restless that you have been moving around a lot more than usual: 0 Thoughts that you would be better off dead, or of hurting yourself in some way: 0 PHQ-9 Total Score: 1 If you checked off any problems, how difficult have these problems made it for you to do your work, take care of things at home, or get along with other people?: Not difficult at all  Depression Treatment Depression Interventions/Treatment : EYV7-0 Score <4 Follow-up Not Indicated     Goals Addressed             This Visit's Progress    Increase physical activity       Exercise/2025       Visit info / Clinical Intake: Medicare Wellness Visit  Type:: Subsequent Annual Wellness Visit Persons participating in visit:: patient Medicare Wellness Visit Mode:: Video If Telephone or Video please confirm:: I connected with the patient using audio enabled telemedicine application and verified that I am speaking with the correct person using two identifiers; I discussed the limitations of evaluation and management by telemedicine; The patient expressed understanding and agreed to proceed Patient Location:: Home Provider Location:: Home Information given by:: patient Interpreter Needed?: No Pre-visit prep was completed: no AWV questionnaire completed by patient prior  to visit?: no Living arrangements:: (!) lives alone Patient's Overall Health Status Rating: very good Typical amount of pain: none Does pain affect daily life?: no Are you currently prescribed opioids?: no  Dietary Habits and Nutritional Risks How many meals a day?: 3 Eats fruit and vegetables daily?: yes Most meals are obtained by: eating out; preparing own meals In the last 2 weeks, have you had any of the following?: none Diabetic:: no  Functional Status Activities of Daily Living (to include ambulation/medication): Independent Ambulation: Independent with device- listed below Home Assistive Devices/Equipment: Eyeglasses Medication Administration: Independent Home Management: Independent Manage your own finances?: yes Primary transportation is: driving Concerns about vision?: no *vision screening is required for WTM* Concerns about hearing?: no  Fall Screening Falls in the past year?: 0 Number of falls in past year: 0 Was there an injury with Fall?: 0 Fall Risk Category Calculator: 0 Patient Fall Risk Level: Low Fall Risk  Fall Risk Patient at Risk for Falls Due to: No Fall Risks Fall risk Follow up: Falls evaluation completed; Falls prevention discussed  Home and Transportation Safety: All rugs have non-skid backing?: yes All stairs or steps have railings?: N/A, no stairs Grab bars in the bathtub or shower?: (!) no Have non-skid surface in bathtub or shower?: yes Good home lighting?: yes Regular seat belt use?: yes Hospital stays in the last year:: no  Cognitive Assessment Difficulty concentrating, remembering, or making decisions? : no Will 6CIT or Mini Cog be Completed: no  Advance Directives (For Healthcare) Does Patient Have a Medical Advance Directive?: No Would patient like information on creating a medical advance directive?: No - Patient declined  Reviewed/Updated  Reviewed/Updated: Reviewed All (Medical, Surgical, Family, Medications, Allergies, Care  Teams, Patient Goals)        Objective:    Today's Vitals   04/11/24 0946  Weight: 151 lb (68.5 kg)  Height: 5' 6.9 (1.699 m)   Body mass index is 23.72 kg/m.  Current Medications (verified) Outpatient Encounter Medications as of 04/11/2024  Medication Sig   amLODipine  (NORVASC ) 5 MG tablet Take 1 tablet (5 mg total) by mouth daily.   aspirin  81 MG chewable tablet Chew 1 tablet (81 mg total) by mouth daily.   clobetasol cream (TEMOVATE) 0.05 % APPLY SPARINGLY TO AFFECTED AREA TWICE A DAY   Evolocumab  (REPATHA  SURECLICK) 140 MG/ML SOAJ Inject 140 mg into the skin every 14 (fourteen) days.   venlafaxine  XR (EFFEXOR -XR) 75 MG 24 hr capsule Take 1 capsule (75 mg total) by mouth daily.   No facility-administered encounter medications on file as of 04/11/2024.   Hearing/Vision screen Hearing Screening - Comments:: Denies hearing difficulties   Vision Screening - Comments:: Wears eyeglasses/Dr. Lyles/UTD Immunizations and Health Maintenance Health Maintenance  Topic Date Due   Bone Density Scan  Never done   Zoster Vaccines- Shingrix (2 of 2) 04/18/2019   Influenza Vaccine  12/18/2023   COVID-19 Vaccine (9 - 2025-26 season) 01/18/2024   Medicare Annual Wellness (AWV)  01/20/2024  Mammogram  09/08/2024   Colonoscopy  03/15/2028   DTaP/Tdap/Td (2 - Td or Tdap) 03/31/2033   Pneumococcal Vaccine: 50+ Years  Completed   Hepatitis C Screening  Completed   Meningococcal B Vaccine  Aged Out        Assessment/Plan:  This is a routine wellness examination for Martha Spencer.  Patient Care Team: Jordan, Betty G, MD as PCP - General (Family Medicine) Charmayne Molly, MD as Consulting Physician (Ophthalmology) Mat Browning, MD as Consulting Physician (Obstetrics and Gynecology)  I have personally reviewed and noted the following in the patient's chart:   Medical and social history Use of alcohol, tobacco or illicit drugs  Current medications and supplements including opioid  prescriptions. Functional ability and status Nutritional status Physical activity Advanced directives List of other physicians Hospitalizations, surgeries, and ER visits in previous 12 months Vitals Screenings to include cognitive, depression, and falls Referrals and appointments  No orders of the defined types were placed in this encounter.  In addition, I have reviewed and discussed with patient certain preventive protocols, quality metrics, and best practice recommendations. A written personalized care plan for preventive services as well as general preventive health recommendations were provided to patient.   Gean Larose L Naomi Fitton, CMA   04/11/2024   No follow-ups on file.  After Visit Summary: (MyChart) Due to this being a telephonic visit, the after visit summary with patients personalized plan was offered to patient via MyChart   Nurse Notes: Patient is due for a 2nd Shingrx vaccine.  Patient stated that she has had a DEXA and I have sent a request for records out today.  She had no other concerns to address today.  Patient will call the office to schedule a 6 month office visit.

## 2024-04-11 NOTE — Patient Instructions (Addendum)
 Ms. Mcfaul,  Thank you for taking the time for your Medicare Wellness Visit. I appreciate your continued commitment to your health goals. Please review the care plan we discussed, and feel free to reach out if I can assist you further.  Please note that Annual Wellness Visits do not include a physical exam. Some assessments may be limited, especially if the visit was conducted virtually. If needed, we may recommend an in-person follow-up with your provider.  Ongoing Care Seeing your primary care provider every 3 to 6 months helps us  monitor your health and provide consistent, personalized care. Last office visit on May 30,2025.  You are due for a 2nd Shingles vaccine.  Aim for 30 minutes of exercise or brisk walking, 6-8 glasses of water, and 5 servings of fruits and vegetables each day.   Referrals If a referral was made during today's visit and you haven't received any updates within two weeks, please contact the referred provider directly to check on the status.  Recommended Screenings:  Health Maintenance  Topic Date Due   Osteoporosis screening with Bone Density Scan  Never done   Zoster (Shingles) Vaccine (2 of 2) 04/18/2019   Flu Shot  12/18/2023   COVID-19 Vaccine (9 - 2025-26 season) 01/18/2024   Medicare Annual Wellness Visit  01/20/2024   Breast Cancer Screening  09/08/2024   Colon Cancer Screening  03/15/2028   DTaP/Tdap/Td vaccine (2 - Td or Tdap) 03/31/2033   Pneumococcal Vaccine for age over 34  Completed   Hepatitis C Screening  Completed   Meningitis B Vaccine  Aged Out       04/11/2024    9:50 AM  Advanced Directives  Does Patient Have a Medical Advance Directive? No    Vision: Annual vision screenings are recommended for early detection of glaucoma, cataracts, and diabetic retinopathy. These exams can also reveal signs of chronic conditions such as diabetes and high blood pressure.  Dental: Annual dental screenings help detect early signs of oral cancer, gum  disease, and other conditions linked to overall health, including heart disease and diabetes.  Please see the attached documents for additional preventive care recommendations.

## 2024-04-18 ENCOUNTER — Ambulatory Visit: Admitting: Family Medicine

## 2024-04-19 ENCOUNTER — Ambulatory Visit: Payer: Self-pay | Admitting: Family Medicine

## 2024-04-19 ENCOUNTER — Encounter: Payer: Self-pay | Admitting: Family Medicine

## 2024-04-19 ENCOUNTER — Ambulatory Visit: Admitting: Family Medicine

## 2024-04-19 VITALS — BP 124/78 | HR 76 | Temp 97.9°F | Resp 16 | Ht 66.9 in | Wt 148.6 lb

## 2024-04-19 DIAGNOSIS — F3341 Major depressive disorder, recurrent, in partial remission: Secondary | ICD-10-CM

## 2024-04-19 DIAGNOSIS — I10 Essential (primary) hypertension: Secondary | ICD-10-CM | POA: Diagnosis not present

## 2024-04-19 DIAGNOSIS — Z0000 Encounter for general adult medical examination without abnormal findings: Secondary | ICD-10-CM | POA: Insufficient documentation

## 2024-04-19 DIAGNOSIS — R748 Abnormal levels of other serum enzymes: Secondary | ICD-10-CM | POA: Diagnosis not present

## 2024-04-19 DIAGNOSIS — E785 Hyperlipidemia, unspecified: Secondary | ICD-10-CM

## 2024-04-19 LAB — BASIC METABOLIC PANEL WITH GFR
BUN: 14 mg/dL (ref 6–23)
CO2: 34 meq/L — ABNORMAL HIGH (ref 19–32)
Calcium: 9.7 mg/dL (ref 8.4–10.5)
Chloride: 102 meq/L (ref 96–112)
Creatinine, Ser: 0.93 mg/dL (ref 0.40–1.20)
GFR: 61.83 mL/min (ref >60.00)
Glucose, Bld: 80 mg/dL (ref 70–99)
Potassium: 4 meq/L (ref 3.5–5.1)
Sodium: 140 meq/L (ref 135–145)

## 2024-04-19 LAB — TSH: TSH: 2.19 u[IU]/mL (ref 0.35–5.50)

## 2024-04-19 LAB — HEPATIC FUNCTION PANEL
ALT: 22 U/L (ref 0–35)
AST: 25 U/L (ref 0–37)
Albumin: 4.6 g/dL (ref 3.5–5.2)
Alkaline Phosphatase: 99 U/L (ref 39–117)
Bilirubin, Direct: 0.1 mg/dL (ref 0.0–0.3)
Total Bilirubin: 1 mg/dL (ref 0.2–1.2)
Total Protein: 7 g/dL (ref 6.0–8.3)

## 2024-04-19 NOTE — Patient Instructions (Addendum)
 A few things to remember from today's visit:  Routine general medical examination at a health care facility  Elevated alkaline phosphatase level - Plan: Hepatic function panel  Essential hypertension - Plan: Basic metabolic panel with GFR, TSH  Recurrent major depressive disorder, in partial remission  If you need refills for medications you take chronically, please call your pharmacy. Do not use My Chart to request refills or for acute issues that need immediate attention. If you send a my chart message, it may take a few days to be addressed, specially if I am not in the office.  Please be sure medication list is accurate. If a new problem present, please set up appointment sooner than planned today. No changes today. Monitor blood pressure at home. I will see you back in a year, before if needed.

## 2024-04-19 NOTE — Progress Notes (Signed)
 Chief Complaint  Patient presents with   Medical Management of Chronic Issues    Six month follow-up    Annual Exam   Discussed the use of AI scribe software for clinical note transcription with the patient, who gave verbal consent to proceed.  History of Present Illness Martha Spencer Diane is a 73 year old female with PMHx significant for HTN,CVA, HLD, and depression who presents for an annual physical exam. Last CPE over a year ago.  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  She experiences sleep disturbances, including difficulty falling asleep and sometimes not sleeping at all, which she attributes to late-night computer use. She has not tried any over-the-counter sleep aids and sleeps approximately seven hours when she does sleep. She sees her eye care provider and dentist regularly. She does not drink alcohol and no hx of tobacco use.  She is up to date with her pneumonia and shingles vaccines.  Osteopenia: Follows with her gynecologist annually. She takes vitamin D in liquid form, two drops daily, and supplements for bone density  Immunization History  Administered Date(s) Administered   Fluzone Influenza virus vaccine,trivalent (IIV3), split virus 01/30/2024   INFLUENZA, HIGH DOSE SEASONAL PF 02/01/2023   Moderna Covid-19 Fall Seasonal Vaccine 76yrs & older 02/13/2022, 08/10/2022   Moderna Covid-19 Vaccine Bivalent Booster 3yrs & up 01/25/2021   Moderna Sars-Covid-2 Vaccination 08/26/2020   PFIZER(Purple Top)SARS-COV-2 Vaccination 06/25/2019, 07/20/2019, 02/26/2020   Pfizer(Comirnaty)Fall Seasonal Vaccine 12 years and older 02/01/2023, 03/22/2024   Pneumococcal Conjugate-13 02/28/2019   Pneumococcal Polysaccharide-23 03/18/2020   Respiratory Syncytial Virus Vaccine,Recomb Aduvanted(Arexvy) 02/09/2022   Tdap 04/01/2023   Zoster Recombinant(Shingrix) 02/21/2019    Health Maintenance  Topic Date Due   Bone Density Scan  Never done   Zoster Vaccines- Shingrix (2 of 2) 04/18/2019   Mammogram  09/08/2024   COVID-19 Vaccine (10 - 2025-26 season) 09/19/2024   Medicare Annual Wellness (AWV)  04/11/2025   Colonoscopy  03/15/2028   DTaP/Tdap/Td (2 - Td or Tdap) 03/31/2033   Pneumococcal Vaccine: 50+ Years  Completed   Influenza Vaccine  Completed   Hepatitis C Screening  Completed   Meningococcal B Vaccine  Aged Out   HLD on Repatha  140 mg q 2 weeks. She has not tolerated statin medication in the past, caused liver abnormalities. She is concerned about the cost of Repatha  as her financial aid is running out, and she is unsure if she needs to reapply. She is due to see her cardiologist again in May 2026.  Lab Results  Component Value Date   CHOL 130 08/04/2023   HDL 52 08/04/2023   LDLCALC 60 08/04/2023   TRIG 96 08/04/2023   CHOLHDL 2.5 08/04/2023   After discontinuing statin med, LFT's improved. Still mildly abnormal alkaline phosphatase.  Lab Results  Component Value Date   ALT 28 10/02/2023   AST 27 10/02/2023   GGT 1,297 (HH) 01/21/2023   ALKPHOS 161 (H) 10/02/2023   BILITOT 0.6 10/02/2023   HTN: Currently on Amlodipine  5 mg daily. She does not regularly monitor her blood pressure at home. S/P CVA in 11/2022 with no significant residual symptoms. She has noticed occasional muscle twitches, not sure about onset, about six months, which occur sporadically and are not associated with headaches or other symptoms. She is on Aspirin  81 mg daily.  Lab Results  Component Value Date   NA 142 05/06/2023  CL 104 05/06/2023   K 4.1 05/06/2023   CO2 23 05/06/2023   BUN 13 05/06/2023   CREATININE 0.84 05/06/2023   EGFR 75 05/06/2023   CALCIUM  8.9 05/06/2023   PHOS 3.9 05/06/2023   ALBUMIN 4.2 10/02/2023   GLUCOSE 75 05/06/2023   Lab Results  Component Value Date   HGBA1C 5.6 12/06/2022   Depression: dx'ed 20-30 years ago Currently  on Effexor  XR 75 mg daily. She feels some anxiety if she misses a dose of medication.    04/11/2024    9:58 AM 08/04/2023    9:50 AM 05/06/2023    9:23 AM 01/20/2023   10:59 AM 01/20/2023    9:49 AM  Depression screen PHQ 2/9  Decreased Interest 0 0 0 1 1  Down, Depressed, Hopeless 0 0 0 0 0  PHQ - 2 Score 0 0 0 1 1  Altered sleeping 1 0  2 2  Tired, decreased energy 0 0  3 3  Change in appetite 0 0  1 1  Feeling bad or failure about yourself  0 0  0 0  Trouble concentrating 0 0  0 0  Moving slowly or fidgety/restless 0 0  0 0  Suicidal thoughts 0 0  0 0  PHQ-9 Score 1 0   7  7   Difficult doing work/chores Not difficult at all Not difficult at all  Somewhat difficult Somewhat difficult     Data saved with a previous flowsheet row definition    Review of Systems  Constitutional:  Negative for activity change, appetite change and fever.  HENT:  Negative for mouth sores, sore throat and trouble swallowing.   Eyes:  Negative for redness and visual disturbance.  Respiratory:  Negative for cough, shortness of breath and wheezing.   Cardiovascular:  Negative for chest pain and leg swelling.  Gastrointestinal:  Negative for abdominal pain, nausea and vomiting.       No changes in bowel habits.  Endocrine: Negative for cold intolerance, heat intolerance, polydipsia, polyphagia and polyuria.  Genitourinary:  Negative for decreased urine volume, dysuria and hematuria.  Musculoskeletal:  Negative for gait problem and myalgias.  Skin:  Negative for color change and rash.  Allergic/Immunologic: Negative for environmental allergies.  Neurological:  Negative for seizures, syncope, weakness and headaches.  Hematological:  Negative for adenopathy. Does not bruise/bleed easily.  Psychiatric/Behavioral:  Positive for sleep disturbance. Negative for confusion. The patient is not nervous/anxious.   All other systems reviewed and are negative.  Current Outpatient Medications on File Prior to Visit   Medication Sig Dispense Refill   amLODipine  (NORVASC ) 5 MG tablet Take 1 tablet (5 mg total) by mouth daily. 90 tablet 3   aspirin  81 MG chewable tablet Chew 1 tablet (81 mg total) by mouth daily. 90 tablet 3   clobetasol cream (TEMOVATE) 0.05 % APPLY SPARINGLY TO AFFECTED AREA TWICE A DAY  0   Evolocumab  (REPATHA  SURECLICK) 140 MG/ML SOAJ Inject 140 mg into the skin every 14 (fourteen) days. 2 mL 11   venlafaxine  XR (EFFEXOR -XR) 75 MG 24 hr capsule Take 1 capsule (75 mg total) by mouth daily. 90 capsule 3   No current facility-administered medications on file prior to visit.   Past Medical History:  Diagnosis Date   Anxiety    Depression    GERD (gastroesophageal reflux disease)    occ   Past Surgical History:  Procedure Laterality Date   ADENOIDECTOMY     COLONOSCOPY  2009  Dr Geroge -poor prep    EYE SURGERY     OOPHORECTOMY     WISDOM TOOTH EXTRACTION      Allergies  Allergen Reactions   Statins Other (See Comments)    Significant elevation in LFTs with liver injury   Sulfa Antibiotics     Unknown reaction   Wheat Other (See Comments)    Glutton Free    Family History  Problem Relation Age of Onset   Hypertension Mother    Diabetes Mother    Thyroid disease Mother    Prostate cancer Brother    Colon cancer Neg Hx    Colon polyps Neg Hx    Esophageal cancer Neg Hx    Rectal cancer Neg Hx    Stomach cancer Neg Hx     Social History   Socioeconomic History   Marital status: Divorced    Spouse name: Not on file   Number of children: 0   Years of education: Not on file   Highest education level: Not on file  Occupational History   Occupation: warden/ranger  Tobacco Use   Smoking status: Never   Smokeless tobacco: Never  Vaping Use   Vaping status: Never Used  Substance and Sexual Activity   Alcohol use: No   Drug use: No   Sexual activity: Not on file  Other Topics Concern   Not on file  Social History Narrative   Lives alone/2025   Social  Drivers of Health   Financial Resource Strain: Low Risk  (01/20/2023)   Overall Financial Resource Strain (CARDIA)    Difficulty of Paying Living Expenses: Not hard at all  Food Insecurity: No Food Insecurity (04/11/2024)   Hunger Vital Sign    Worried About Running Out of Food in the Last Year: Never true    Ran Out of Food in the Last Year: Never true  Transportation Needs: No Transportation Needs (04/11/2024)   PRAPARE - Administrator, Civil Service (Medical): No    Lack of Transportation (Non-Medical): No  Physical Activity: Inactive (04/11/2024)   Exercise Vital Sign    Days of Exercise per Week: 0 days    Minutes of Exercise per Session: 0 min  Stress: No Stress Concern Present (04/11/2024)   [name]    Feeling of Stress: Only a little  Social Connections: Socially Isolated (04/11/2024)   Social Connection and Isolation Panel    Frequency of Communication with Friends and Family: Once a week    Frequency of Social Gatherings with Friends and Family: More than three times a week    Attends Religious Services: Never    Database Administrator or Organizations: No    Attends Banker Meetings: Never    Marital Status: Divorced   Vitals:   04/19/24 1036  BP: 124/78  Pulse: 76  Resp: 16  Temp: 97.9 F (36.6 C)  SpO2: 99%   Body mass index is 23.34 kg/m.  Wt Readings from Last 3 Encounters:  04/19/24 148 lb 9.6 oz (67.4 kg)  04/11/24 151 lb (68.5 kg)  10/16/23 151 lb 6.4 oz (68.7 kg)   Physical Exam Vitals and nursing note reviewed.  Constitutional:      General: She is not in acute distress.    Appearance: She is well-developed.  HENT:     Head: Normocephalic and atraumatic.     Right Ear: Hearing, tympanic membrane, ear canal and external ear normal.  Left Ear: Hearing, tympanic membrane, ear canal and external ear normal.     Mouth/Throat:     Mouth: Mucous membranes  are moist.     Pharynx: Oropharynx is clear. Uvula midline.  Eyes:     Extraocular Movements: Extraocular movements intact.     Conjunctiva/sclera: Conjunctivae normal.     Pupils: Pupils are equal, round, and reactive to light.  Neck:     Thyroid: No thyromegaly.     Trachea: No tracheal deviation.  Cardiovascular:     Rate and Rhythm: Normal rate and regular rhythm.     Pulses:          Dorsalis pedis pulses are 2+ on the right side and 2+ on the left side.     Heart sounds: No murmur heard. Pulmonary:     Effort: Pulmonary effort is normal. No respiratory distress.     Breath sounds: Normal breath sounds.  Abdominal:     Palpations: Abdomen is soft. There is no hepatomegaly or mass.     Tenderness: There is no abdominal tenderness.  Genitourinary:    Comments: Deferred to gyn. Musculoskeletal:     Comments: No major deformity or signs of synovitis appreciated.  Lymphadenopathy:     Cervical: No cervical adenopathy.     Upper Body:     Right upper body: No supraclavicular adenopathy.     Left upper body: No supraclavicular adenopathy.  Skin:    General: Skin is warm.     Findings: No erythema or rash.  Neurological:     General: No focal deficit present.     Mental Status: She is alert and oriented to person, place, and time.     Cranial Nerves: No cranial nerve deficit.     Coordination: Coordination normal.     Gait: Gait normal.     Deep Tendon Reflexes:     Reflex Scores:      Bicep reflexes are 2+ on the right side and 2+ on the left side.      Patellar reflexes are 2+ on the right side and 2+ on the left side. Psychiatric:        Mood and Affect: Mood and affect normal.    ASSESSMENT AND PLAN:  Ms. Danial Sisley was here today annual physical examination.  Orders Placed This Encounter  Procedures   Basic metabolic panel with GFR   Hepatic function panel   TSH   Lab Results  Component Value Date   TSH 2.19 04/19/2024   Lab Results  Component  Value Date   NA 140 04/19/2024   CL 102 04/19/2024   K 4.0 04/19/2024   CO2 34 (H) 04/19/2024   BUN 14 04/19/2024   CREATININE 0.93 04/19/2024   GFR 61.83 04/19/2024   CALCIUM  9.7 04/19/2024   PHOS 3.9 05/06/2023   ALBUMIN 4.6 04/19/2024   GLUCOSE 80 04/19/2024   Lab Results  Component Value Date   ALT 22 04/19/2024   AST 25 04/19/2024   GGT 1,297 (HH) 01/21/2023   ALKPHOS 99 04/19/2024   BILITOT 1.0 04/19/2024   Routine general medical examination at a health care facility Assessment & Plan: We discussed the importance of regular physical activity and healthy diet for prevention of chronic illness and/or complications. Preventive guidelines reviewed. Vaccination up-to-date, [facility]. She follows with gynecologist annually for her female preventive care, DEXA and mammogram are current. Ca++ and vit D supplementation to continue. Next CPE in a year.  Elevated alkaline  phosphatase level In 01/2023 it was 1,888, since then it has gradually improved. Last one 161 in 09/2023. RUQ US  in 01/2023: Liver hemangioma and normal gallbladder. Will continue following regularly.  -     Hepatic function panel; Future  Essential hypertension Assessment & Plan: Problem is adequately controlled. Continue amlodipine  5 mg daily and low-salt diet. Recommend monitoring BP regularly. She will see Dr. Mona around 09/2024. Follow-up in a year, before if needed.  Orders: -     Basic metabolic panel with GFR; Future -     TSH; Future  Recurrent major depressive disorder, in partial remission Assessment & Plan: Problem is stable. She would like to continue Effexor  XR 75 mg daily. As far as problem is stable, we can continue annual follow-ups.  Hyperlipidemia, unspecified hyperlipidemia type Assessment & Plan: Did not tolerate statins in the past, rosuvastatin caused liver abnormalities. LDL 60 in 07/2023. Currently she is on Repatha  140 mg every 2 weeks.  She is going to be  due for a new prescription at the end of this month, she is currently part of a patient assistant program through Dr. Katharina office.  Recommend calling his office to renew prescription through the same program. Continue low-fat diet.  Return in 1 year (on 04/19/2025) for CPE, chronic problems.  Secundino Ellithorpe G. Ave Scharnhorst, MD  [facility]. [place] office.

## 2024-04-19 NOTE — Assessment & Plan Note (Signed)
 Problem is adequately controlled. Continue amlodipine  5 mg daily and low-salt diet. Recommend monitoring BP regularly. She will see Dr. Mona around 09/2024. Follow-up in a year, before if needed.

## 2024-04-19 NOTE — Assessment & Plan Note (Signed)
 We discussed the importance of regular physical activity and healthy diet for prevention of chronic illness and/or complications. Preventive guidelines reviewed. Vaccination up-to-date, [facility]. She follows with gynecologist annually for her female preventive care, DEXA and mammogram are current. Ca++ and vit D supplementation to continue. Next CPE in a year.

## 2024-04-19 NOTE — Assessment & Plan Note (Signed)
 Problem is stable. She would like to continue Effexor  XR 75 mg daily. As far as problem is stable, we can continue annual follow-ups.

## 2024-04-19 NOTE — Assessment & Plan Note (Signed)
 Did not tolerate statins in the past, rosuvastatin caused liver abnormalities. LDL 60 in 07/2023. Currently she is on Repatha  140 mg every 2 weeks.  She is going to be due for a new prescription at the end of this month, she is currently part of a patient assistant program through Dr. Katharina office.  Recommend calling his office to renew prescription through the same program. Continue low-fat diet.

## 2024-04-29 ENCOUNTER — Telehealth: Payer: Self-pay | Admitting: Pharmacy Technician

## 2024-04-29 ENCOUNTER — Other Ambulatory Visit (HOSPITAL_COMMUNITY): Payer: Self-pay

## 2024-04-29 NOTE — Telephone Encounter (Signed)
 Pharmacy Patient Advocate Encounter   Received notification from Fax that prior authorization for Repatha  is required/requested.   Insurance verification completed.   The patient is insured through [employer].   Per test claim: PA required; PA submitted to above mentioned insurance via Latent Key/confirmation #/EOC [id number] Status is pending

## 2024-04-29 NOTE — Telephone Encounter (Signed)
 Pharmacy Patient Advocate Encounter  Received notification from [facility] that Prior Authorization for Repatha  has been APPROVED from 03/30/24 to 04/29/25. Unable to obtain price due to refill too soon rejection, last fill date 04/09/24 next available fill date12/15/25   PA #/Case ID/Reference #: [id number]

## 2024-06-07 ENCOUNTER — Telehealth: Payer: Self-pay | Admitting: Internal Medicine

## 2024-06-07 NOTE — Telephone Encounter (Signed)
" °*  STAT* If patient is at the pharmacy, call can be transferred to refill team.   1. Which medications need to be refilled? (please list name of each medication and dose if known) Evolocumab  (REPATHA  SURECLICK) 140 MG/ML SOAJ   2. Which pharmacy/location (including street and city if local pharmacy) is medication to be sent to?  [id number] - [place], [place] - [address]      3. Do they need a 30 day or 90 day supply? 90 day   Pt is out of medication  "

## 2024-06-08 ENCOUNTER — Other Ambulatory Visit: Payer: Self-pay

## 2024-06-09 NOTE — Telephone Encounter (Signed)
 Please see below. Patient is looking to get medication before the weekend. Please advise

## 2024-06-10 ENCOUNTER — Other Ambulatory Visit: Payer: Self-pay | Admitting: Internal Medicine

## 2024-06-10 MED ORDER — REPATHA SURECLICK 140 MG/ML ~~LOC~~ SOAJ: 140.0000 mg | SUBCUTANEOUS | 5 refills | Status: DC

## 2024-06-10 NOTE — Telephone Encounter (Signed)
Rx sent earlier today.

## 2024-06-10 NOTE — Telephone Encounter (Signed)
 Pt's medication was sent to pt's pharmacy as requested. Confirmation received.

## 2024-06-13 ENCOUNTER — Other Ambulatory Visit (HOSPITAL_COMMUNITY): Payer: Self-pay

## 2024-06-13 ENCOUNTER — Telehealth: Payer: Self-pay | Admitting: Pharmacy Technician

## 2024-06-13 NOTE — Telephone Encounter (Signed)
" ° °  Pa is still good until 04/29/25 copay is 25.00 without grant  Patient Advocate Encounter   The patient was approved for a [employer] grant that will help cover the cost of repatha  Total amount awarded, 2500.  Effective: 05/14/24 - 05/13/25   APW:[id number] ERW:[id number] Hmnle:[id number] PI:[id number] [employer] ID: [id number]   Pharmacy provided with approval and processing information. Patient informed via mychart  "
